# Patient Record
Sex: Male | Born: 1940
Health system: Southern US, Community
[De-identification: ages and names within clinical notes are randomized; demographics above are authoritative.]

## PROBLEM LIST (undated history)

## (undated) ENCOUNTER — Emergency Department (HOSPITAL_BASED_OUTPATIENT_CLINIC_OR_DEPARTMENT_OTHER): Admission: EM | Discharge status: Absent without leave | Payer: Medicare Other

## (undated) DIAGNOSIS — Z86010 Personal history of colonic polyps: Secondary | ICD-10-CM

## (undated) DIAGNOSIS — E785 Hyperlipidemia, unspecified: Secondary | ICD-10-CM

## (undated) DIAGNOSIS — I10 Essential (primary) hypertension: Secondary | ICD-10-CM

## (undated) DIAGNOSIS — F528 Other sexual dysfunction not due to a substance or known physiological condition: Secondary | ICD-10-CM

## (undated) HISTORY — PX: TONSILLECTOMY: SHX5217

## (undated) HISTORY — DX: Personal history of colonic polyps: Z86.010

## (undated) HISTORY — DX: Essential (primary) hypertension: I10

## (undated) HISTORY — DX: Other sexual dysfunction not due to a substance or known physiological condition: F52.8

## (undated) HISTORY — DX: Hyperlipidemia, unspecified: E78.5

---

## 2001-02-23 ENCOUNTER — Ambulatory Visit (HOSPITAL_COMMUNITY): Admission: RE | Admit: 2001-02-23 | Discharge: 2001-02-23 | Payer: Self-pay | Admitting: Gastroenterology

## 2001-02-23 ENCOUNTER — Encounter (INDEPENDENT_AMBULATORY_CARE_PROVIDER_SITE_OTHER): Payer: Self-pay | Admitting: *Deleted

## 2001-06-29 ENCOUNTER — Encounter (INDEPENDENT_AMBULATORY_CARE_PROVIDER_SITE_OTHER): Payer: Self-pay | Admitting: *Deleted

## 2001-06-29 ENCOUNTER — Ambulatory Visit (HOSPITAL_COMMUNITY): Admission: RE | Admit: 2001-06-29 | Discharge: 2001-06-29 | Payer: Self-pay | Admitting: Gastroenterology

## 2003-10-24 ENCOUNTER — Ambulatory Visit (HOSPITAL_COMMUNITY): Admission: RE | Admit: 2003-10-24 | Discharge: 2003-10-24 | Payer: Self-pay | Admitting: Gastroenterology

## 2003-10-24 ENCOUNTER — Encounter (INDEPENDENT_AMBULATORY_CARE_PROVIDER_SITE_OTHER): Payer: Self-pay | Admitting: Specialist

## 2007-04-06 ENCOUNTER — Encounter: Payer: Self-pay | Admitting: Family Medicine

## 2007-04-06 ENCOUNTER — Ambulatory Visit: Payer: Self-pay | Admitting: Surgery

## 2007-04-06 ENCOUNTER — Ambulatory Visit (HOSPITAL_COMMUNITY): Admission: RE | Admit: 2007-04-06 | Discharge: 2007-04-06 | Payer: Self-pay | Admitting: Family Medicine

## 2008-12-08 ENCOUNTER — Ambulatory Visit: Payer: Self-pay | Admitting: Family Medicine

## 2008-12-08 DIAGNOSIS — E785 Hyperlipidemia, unspecified: Secondary | ICD-10-CM

## 2008-12-08 DIAGNOSIS — F528 Other sexual dysfunction not due to a substance or known physiological condition: Secondary | ICD-10-CM

## 2008-12-08 DIAGNOSIS — I10 Essential (primary) hypertension: Secondary | ICD-10-CM

## 2008-12-08 HISTORY — DX: Hyperlipidemia, unspecified: E78.5

## 2008-12-08 HISTORY — DX: Essential (primary) hypertension: I10

## 2008-12-08 HISTORY — DX: Other sexual dysfunction not due to a substance or known physiological condition: F52.8

## 2009-02-20 ENCOUNTER — Ambulatory Visit: Payer: Self-pay | Admitting: Family Medicine

## 2009-02-20 LAB — CONVERTED CEMR LAB
ALT: 24 units/L (ref 0–53)
Albumin: 4 g/dL (ref 3.5–5.2)
BUN: 20 mg/dL (ref 6–23)
Basophils Relative: 0 % (ref 0.0–3.0)
Bilirubin, Direct: 0.1 mg/dL (ref 0.0–0.3)
CO2: 30 meq/L (ref 19–32)
Chloride: 109 meq/L (ref 96–112)
Cholesterol: 151 mg/dL (ref 0–200)
Creatinine, Ser: 0.9 mg/dL (ref 0.4–1.5)
Eosinophils Absolute: 0.2 10*3/uL (ref 0.0–0.7)
HCT: 48.6 % (ref 39.0–52.0)
Lymphs Abs: 1.7 10*3/uL (ref 0.7–4.0)
MCHC: 34.7 g/dL (ref 30.0–36.0)
MCV: 93 fL (ref 78.0–100.0)
Monocytes Absolute: 0.3 10*3/uL (ref 0.1–1.0)
Neutrophils Relative %: 49.4 % (ref 43.0–77.0)
PSA: 1.18 ng/mL (ref 0.10–4.00)
Platelets: 155 10*3/uL (ref 150.0–400.0)
Potassium: 4.1 meq/L (ref 3.5–5.1)
RBC: 5.22 M/uL (ref 4.22–5.81)
TSH: 1.56 microintl units/mL (ref 0.35–5.50)
Total Protein: 6.4 g/dL (ref 6.0–8.3)
Triglycerides: 48 mg/dL (ref 0.0–149.0)

## 2009-02-27 ENCOUNTER — Ambulatory Visit: Payer: Self-pay | Admitting: Family Medicine

## 2009-02-27 DIAGNOSIS — Z860100 Personal history of colon polyps, unspecified: Secondary | ICD-10-CM | POA: Insufficient documentation

## 2009-02-27 HISTORY — DX: Personal history of colonic polyps: Z86.010

## 2009-05-08 ENCOUNTER — Encounter (INDEPENDENT_AMBULATORY_CARE_PROVIDER_SITE_OTHER): Payer: Self-pay | Admitting: *Deleted

## 2009-05-08 ENCOUNTER — Encounter: Payer: Self-pay | Admitting: Family Medicine

## 2010-03-29 ENCOUNTER — Ambulatory Visit: Payer: Self-pay | Admitting: Family Medicine

## 2010-03-29 LAB — CONVERTED CEMR LAB
AST: 16 units/L (ref 0–37)
Albumin: 4.1 g/dL (ref 3.5–5.2)
Alkaline Phosphatase: 58 units/L (ref 39–117)
Basophils Relative: 0.5 % (ref 0.0–3.0)
Bilirubin Urine: NEGATIVE
Bilirubin, Direct: 0.2 mg/dL (ref 0.0–0.3)
CO2: 30 meq/L (ref 19–32)
Calcium: 9 mg/dL (ref 8.4–10.5)
Creatinine, Ser: 0.9 mg/dL (ref 0.4–1.5)
Eosinophils Absolute: 0.2 10*3/uL (ref 0.0–0.7)
Eosinophils Relative: 2.9 % (ref 0.0–5.0)
GFR calc non Af Amer: 87.32 mL/min (ref >60)
Hemoglobin: 16.9 g/dL (ref 13.0–17.0)
Ketones, ur: NEGATIVE mg/dL
LDL Cholesterol: 108 mg/dL — ABNORMAL HIGH (ref 0–99)
Leukocytes, UA: NEGATIVE
Lymphocytes Relative: 30.6 % (ref 12.0–46.0)
Monocytes Relative: 6.3 % (ref 3.0–12.0)
Neutro Abs: 3.6 10*3/uL (ref 1.4–7.7)
Neutrophils Relative %: 59.7 % (ref 43.0–77.0)
Nitrite: NEGATIVE
RBC: 5.29 M/uL (ref 4.22–5.81)
Sodium: 142 meq/L (ref 135–145)
Specific Gravity, Urine: 1.02 (ref 1.000–1.030)
Total CHOL/HDL Ratio: 5
Total Protein: 6.3 g/dL (ref 6.0–8.3)
Triglycerides: 87 mg/dL (ref 0.0–149.0)
Urobilinogen, UA: 0.2 (ref 0.0–1.0)
WBC: 6 10*3/uL (ref 4.5–10.5)
pH: 6.5 (ref 5.0–8.0)

## 2010-04-05 ENCOUNTER — Ambulatory Visit: Payer: Self-pay | Admitting: Family Medicine

## 2010-04-12 ENCOUNTER — Ambulatory Visit: Payer: Self-pay | Admitting: Family Medicine

## 2010-08-15 LAB — CONVERTED CEMR LAB: Cholesterol, target level: 200 mg/dL

## 2010-08-19 NOTE — Assessment & Plan Note (Signed)
Summary: CPX/CJR   Vital Signs:  Patient profile:   70 year old male Height:      67.5 inches Weight:      228 pounds BMI:     35.31 O2 Sat:      96 % Temp:     98.5 degrees F Pulse rate:   66 / minute Pulse rhythm:   regular Resp:     16 per minute BP sitting:   132 / 80  Vitals Entered By: Lynann Beaver CMA (April 05, 2010 11:30 AM)  Nutrition Counseling: Patient's BMI is greater than 25 and therefore counseled on weight management options. CC: cpx, Hypertension Management, Lipid Management Is Patient Diabetic? No Pain Assessment Patient in pain? no        History of Present Illness: Pt here for wellness assessment and follow up medical problems.  Here for Medicare AWV:  1.   Risk factors based on Past M, S, F history:  hypertension and hyperlipidemia.  Father had prostate cancer.  Pt has hx hyperplastic colon polyps. 2.   Physical Activities: retired.  No regular exercise. 3.   Depression/mood: no hx of depression or anxiety disorder.  No current depressive symptoms. 4.   Hearing: Mild defecit for high pitch but no functional impairment. 5.   ADL's: Fully independent in all ADLs/ 6.   Fall Risk: Low .  No major visual impairment (with glasses), orthopedic RFs, or vestibular problems. 7.   Home Safety: No concerns identified. 8.   Height, weight, &visual acuity:  Elevated BMI but wt stable.  Ht unchanged.  Vision checked yearly by eye doctor and no signi changes.  Wears glasses. 9.   Counseling: Needs more exercise and to lose some weight.  Handouts given and discussed strategies for wt loss. 10.   Labs ordered based on risk factors: see orders, CBC,BMP, LIPID, Hepatic, TSH 11.           Referral Coordination  cont yearly eye exam.  Will not need repeat colonoscopy until 4 years from now. 12.           Care Plan  Flu vaccine given. Other immunizations up to date.  Exercise more.  Colonoscopy 4 years.  Cont yearly eye exam. 13.            Cognitive Assessment  No  impairment in short or long term memory.  No deficiency in reasoning skills.  Hx of erectile dysfunction and Levitra works well for him.  Needs refills.  No side effects.  Hypertension History:      He denies headache, chest pain, palpitations, orthopnea, PND, peripheral edema, visual symptoms, neurologic problems, syncope, and side effects from treatment.  He notes no problems with any antihypertensive medication side effects.  Further comments include: mild dyspnea with exertion which is chronic and not assoc with any chest discomfort.        Positive major cardiovascular risk factors include male age 46 years old or older, hyperlipidemia, and hypertension.  Negative major cardiovascular risk factors include no history of diabetes, negative family history for ischemic heart disease, and non-tobacco-user status.        Further assessment for target organ damage reveals no history of ASHD, stroke/TIA, or peripheral vascular disease.    Lipid Management History:      Positive NCEP/ATP III risk factors include male age 41 years old or older, HDL cholesterol less than 40, and hypertension.  Negative NCEP/ATP III risk factors include non-diabetic, no family history for ischemic  heart disease, non-tobacco-user status, no ASHD (atherosclerotic heart disease), no prior stroke/TIA, no peripheral vascular disease, and no history of aortic aneurysm.      Clinical Review Panels:  Prevention   Last Colonoscopy:  Hyperplastic Polyp (05/08/2009)   Last PSA:  1.26 (03/29/2010)  Lipid Management   Cholesterol:  157 (03/29/2010)   LDL (bad choesterol):  108 (03/29/2010)   HDL (good cholesterol):  31.30 (03/29/2010)  Diabetes Management   Creatinine:  0.9 (03/29/2010)   Last Flu Vaccine:  Fluvax 3+ (04/05/2010)   Last Pneumovax:  given (07/19/2004)  CBC   WBC:  6.0 (03/29/2010)   RBC:  5.29 (03/29/2010)   Hgb:  16.9 (03/29/2010)   Hct:  48.9 (03/29/2010)   Platelets:  181.0 (03/29/2010)   MCV  92.5  (03/29/2010)   MCHC  34.6 (03/29/2010)   RDW  13.3 (03/29/2010)   PMN:  59.7 (03/29/2010)   Lymphs:  30.6 (03/29/2010)   Monos:  6.3 (03/29/2010)   Eosinophils:  2.9 (03/29/2010)   Basophil:  0.5 (03/29/2010)  Complete Metabolic Panel   Glucose:  99 (03/29/2010)   Sodium:  142 (03/29/2010)   Potassium:  4.5 (03/29/2010)   Chloride:  104 (03/29/2010)   CO2:  30 (03/29/2010)   BUN:  20 (03/29/2010)   Creatinine:  0.9 (03/29/2010)   Albumin:  4.1 (03/29/2010)   Total Protein:  6.3 (03/29/2010)   Calcium:  9.0 (03/29/2010)   Total Bili:  1.1 (03/29/2010)   Alk Phos:  58 (03/29/2010)   SGPT (ALT):  19 (03/29/2010)   SGOT (AST):  16 (03/29/2010)   Current Medications (verified): 1)  Amlodipine Besylate 10 Mg Tabs (Amlodipine Besylate) .... Once Daily 2)  Pravastatin Sodium 40 Mg Tabs (Pravastatin Sodium) .... Once Daily 3)  Adult Aspirin Low Strength 81 Mg Tbdp (Aspirin) .... Once Daily 4)  Acetaminophen Extra Strength 500 Mg Tabs (Acetaminophen) .... 2 Tabs Daily 5)  Levitra 20 Mg Tabs (Vardenafil Hcl) .... One By Mouth Once Daily As Needed  Allergies (verified): 1)  Penicillin V Potassium (Penicillin V Potassium)  Past History:  Past Medical History: Last updated: 02/27/2009 Colonic polyps, hx of Hyperlipidemia Hypertension erectile dysfunction  Past Surgical History: Last updated: 12/08/2008 Tonsillectomy  Family History: Last updated: 12/08/2008 Family History of Prostate CA 1st degree relative   Social History: Last updated: 04/05/2010 Married Former Smoker (pipe) Alcohol use-no Retired July 2011  Risk Factors: Smoking Status: quit (12/08/2008) PMH-FH-SH reviewed for relevance  Social History: Married Former Smoker (pipe) Alcohol use-no Retired July 2011  Review of Systems       The patient complains of suspicious skin lesions.  The patient denies anorexia, fever, weight loss, weight gain, vision loss, decreased hearing, hoarseness, chest pain,  syncope, dyspnea on exertion, peripheral edema, prolonged cough, headaches, hemoptysis, abdominal pain, melena, hematochezia, severe indigestion/heartburn, hematuria, incontinence, genital sores, muscle weakness, transient blindness, difficulty walking, depression, unusual weight change, abnormal bleeding, enlarged lymph nodes, and testicular masses.    Physical Exam  Skin:  couple of large skin tags R thign and waist area. Cervical Nodes:  No lymphadenopathy noted Psych:  Oriented X3, normally interactive, good eye contact, not anxious appearing, and not depressed appearing.     Impression & Recommendations:  Problem # 1:  PHYSICAL EXAMINATION (ICD-V70.0) flu vaccine given.  Pt needs to work on weight loss. Orders: EKG w/ Interpretation (93000) Medicare -1st Annual Wellness Visit [phone])  Problem # 2:  HYPERLIPIDEMIA (ICD-272.4)  His updated medication list for this problem includes:  Pravastatin Sodium 40 Mg Tabs (Pravastatin sodium) ..... Once daily  Problem # 3:  ERECTILE DYSFUNCTION (ICD-302.72)  His updated medication list for this problem includes:    Levitra 20 Mg Tabs (Vardenafil hcl) ..... One by mouth once daily as needed  Problem # 4:  HYPERTENSION (ICD-401.9)  His updated medication list for this problem includes:    Amlodipine Besylate 10 Mg Tabs (Amlodipine besylate) ..... Once daily  Complete Medication List: 1)  Amlodipine Besylate 10 Mg Tabs (Amlodipine besylate) .... Once daily 2)  Pravastatin Sodium 40 Mg Tabs (Pravastatin sodium) .... Once daily 3)  Acetaminophen Extra Strength 500 Mg Tabs (Acetaminophen) .... 2 tabs daily 4)  Levitra 20 Mg Tabs (Vardenafil hcl) .... One by mouth once daily as needed  Other Orders: Admin 1st Vaccine (24401) Flu Vaccine 77yrs + (02725)  Hypertension Assessment/Plan:      The patient's hypertensive risk group is category B: At least one risk factor (excluding diabetes) with no target organ damage.  His calculated 10 year  risk of coronary heart disease is 33 %.  Today's blood pressure is 132/80.    Lipid Assessment/Plan:      Based on NCEP/ATP III, the patient's risk factor category is "0-1 risk factors".  The patient's lipid goals are as follows: Total cholesterol goal is 200; LDL cholesterol goal is 100; HDL cholesterol goal is 40; Triglyceride goal is 150.    Patient Instructions: 1)  Schedule 30 minute follow up for skin lesion excision. 2)  It is important that you exercise reguarly at least 20 minutes 5 times a week. If you develop chest pain, have severe difficulty breathing, or feel very tired, stop exercising immediately and seek medical attention.  3)  You need to lose weight. Consider a lower calorie diet and regular exercise.  Prescriptions: LEVITRA 20 MG TABS (VARDENAFIL HCL) one by mouth once daily as needed  #6 x 11   Entered and Authorized by:   Evelena Peat MD   Signed by:   Evelena Peat MD on 04/05/2010   Method used:   Electronically to        [facility]* (retail)       [address] [place], [place]  [address]       Ph: [phone]       Fax: [phone]   RxID:   [id number]  Flu Vaccine Consent Questions     Do you have a history of severe allergic reactions to this vaccine? no    Any prior history of allergic reactions to egg and/or gelatin? no    Do you have a sensitivity to the preservative Thimersol? no    Do you have a past history of Guillan-Barre Syndrome? no    Do you currently have an acute febrile illness? no    Have you ever had a severe reaction to latex? no    Vaccine information given and explained to patient? yes    Are you currently pregnant? no    Lot Number:[id number]   Exp Date:01/15/2011   Site Given  Left Deltoid [id number]   RxID:   [id number]    Preventive Care Screening  Last Flu Shot:    Date:  04/05/2010    Results:  Fluvax 3+  Colonoscopy:    Date:  05/08/2009    Results:  Hyperplastic  Polyp   Last Tetanus Booster:    Date:  07/19/2004  Results:  Tdap   Last Pneumovax:    Date:  07/19/2004    Results:  given     Flu Vaccine Consent Questions     Do you have a history of severe allergic reactions to this vaccine? no    Any prior history of allergic reactions to egg and/or gelatin? no    Do you have a sensitivity to the preservative Thimersol? no    Do you have a past history of Guillan-Barre Syndrome? no    Do you currently have an acute febrile illness? no    Have you ever had a severe reaction to latex? no    Vaccine information given and explained to patient? yes    Are you currently pregnant? no    Lot Number:[id number]   Exp Date:01/15/2011   Site Given  Left Deltoid IM

## 2010-08-19 NOTE — Assessment & Plan Note (Signed)
Summary: skin lesion removal/njr   Vital Signs:  Patient profile:   70 year old male Weight:      228 pounds Temp:     98.1 degrees F oral BP sitting:   130 / 78  (left arm) Cuff size:   regular  Vitals Entered By: Sid Falcon LPN (April 12, 2010 10:44 AM)  History of Present Illness: Patient here for skin lesion removal. Right lower abdomen and right thigh large skin tags which are irritating because of location. Mid lower abdomen below umbilicus he has large scaly lesion which again is irritating because of location. No history of skin cancer. These lesions noted recently on physical examination.  Allergies: 1)  Penicillin V Potassium (Penicillin V Potassium)  Physical Exam  General:  Well-developed,well-nourished,in no acute distress; alert,appropriate and cooperative throughout examination Skin:  patient has large skin tags right lower abdomen and right upper anterior thigh. Lower abdomen near midline large brown well-demarcated scaly lesion consistent with seborrheic keratosis   Impression & Recommendations:  Problem # 1:  ACROCHORDON (ICD-701.9) after obtaining informed consent prepped R thigh and R lower abd lesions with alcohol and anesth with 1% xyloc with epi. Shave exsicion with #15 blade.  Minimal bleeding controlled  with drysol.  topical antibiotic and dressing applied. Orders: Shave Skin Lesion 0.6-1.0 cm/trunk/arm/leg (11301)  Problem # 2:  SEBORRHEIC KERATOSIS (ICD-702.19) after obtaining informed consent, treated with liquid N and pt tolerated well. Orders: Cryotherapy/Destruction benign or premalignant lesion (1st lesion)  (17000)  Complete Medication List: 1)  Amlodipine Besylate 10 Mg Tabs (Amlodipine besylate) .... Once daily 2)  Pravastatin Sodium 40 Mg Tabs (Pravastatin sodium) .... Once daily 3)  Acetaminophen Extra Strength 500 Mg Tabs (Acetaminophen) .... 2 tabs daily 4)  Levitra 20 Mg Tabs (Vardenafil hcl) .... One by mouth once daily as  needed  Patient Instructions: 1)  Keep area dry as possible for the first day and then clean daily with soap and water 2)  Use topical antibiotic such as Polysporin or Neosporin daily for the next 3-4 days 3)  Followup promptly for any signs of secondary infection such as redness, warmth, or puslike drainage

## 2010-09-10 ENCOUNTER — Telehealth: Payer: Self-pay | Admitting: Family Medicine

## 2010-09-10 MED ORDER — AMLODIPINE BESYLATE 10 MG PO TABS: 10.0000 mg | ORAL_TABLET | Freq: Every day | ORAL | Status: DC

## 2010-09-10 MED ORDER — PRAVASTATIN SODIUM 40 MG PO TABS: 40.0000 mg | ORAL_TABLET | Freq: Every evening | ORAL | Status: DC

## 2010-09-10 NOTE — Telephone Encounter (Signed)
Called wife - we have not recv'd fax from pharmacy from [place] - I will call out rx's to them. KIK

## 2010-09-10 NOTE — Telephone Encounter (Signed)
Pts wife called to ck on status of refill on med: pravastatin, norvasc...Marland KitchenMarland KitchenMarland Kitchen [facility] - [address] [place],  [place]  [address].... Fax # [phone],   # [phone]...Marland KitchenMarland Kitchen Pts # [id number]....the patient is out of med, please take care of asap.

## 2010-12-03 NOTE — Procedures (Signed)
[place]  Patient:    Jeremiah Merritt, Jeremiah Merritt                      MRN: [id number] Proc. Date: 02/23/01 Adm. Date:  [id number] Attending:  Nelda Marseille CC:         Delorse Lek, M.D.   Procedure Report  PROCEDURE PERFORMED:  Colonoscopy with multiple polypectomies.  ENDOSCOPIST:  Petra Kuba, M.D.  INDICATIONS FOR PROCEDURE:  Patient due for colonic screening.  Consent was signed after risks, benefits, methods, and options were thoroughly discussed in the office.  MEDICATIONS USED:  Demerol 70 mg, Versed 7 mg.  DESCRIPTION OF PROCEDURE:  Rectal inspection was pertinent for external hemorrhoids.  Digital exam was negative.  The video colonoscope was inserted and easily advanced around the colon to the cecum.  Multiple right-sided polyps were seen.  One sigmoid polyp and some very early diverticula were seen on insertion.  The cecum was identified by the appendiceal orifice and the ileocecal valve.  The prep was adequate.  There was some liquid stool that required washing and suctioning.  On slow withdrawal through the colon, multiple ascending hepatic flexure and proximal transverse polyps were all seen, snared, electrocautery applied and the majority of them were suctioned through the scope and collected in the trap. These were all semisessile. There were also some smaller ones which required one or two hot biopsies. There were some residual polyps after some of these sessile snares and multiple hot biopsies of the edges were obtained.  Two of the polyps that were removed, the pieces were too big to suction through the scope and using the retriever basket after multiple polypectomies, the two polyps were grabbed in the basket and the scope was removed and the polyps recovered.  The scope was reinserted and readvanced to the cecum.  On insertion, a sigmoid polyp was seen, snared, electrocautery applied.  Unfortunately, this polyp after  we removed it went proximally and when we found other polyp particles, it was not possible to say which polyp this was.  One other sigmoid small polyp was seen and was hot biopsied x 1.  Also on insertion, two descending polyps were seen and hot biopsied x 1 as well.  Based on not recovering the sigmoid polyp for sure, we went ahead and put all polyps in the same container.  We readvanced to the cecum and then on slow withdrawal, some of the polyps may have had residual tissue that was already removed and a few more hot biopsies were done.  One seemed to have a little bit more than just a hot biopsy worth and this area was snared, electrocautery applied and the polyp was suctioned through the scope after removal and collected in the trap.  The scope was then slowly withdrawn.  No additional findings or other polyps were seen.  Once back in the rectum, the scope was retroflexed, pertinent for some internal hemorrhoids.  The scope was straightened, air was withdrawn, the scope removed.  The patient tolerated the procedure excellently.  There was no obvious immediate complication.  ENDOSCOPIC DIAGNOSIS: 1. Internal and external hemorrhoids. 2. Early left left-sided diverticula. 3. One sigmoid small polyp snared and one small one hot. 4. Two descending small polyps hot. 5. Multiple hepatic flexure, ascending, proximal transverse, with multiple    snares and hot biopsies with the majority being small to medium-sized    sessile polyps. 6. Otherwise within normal limits  to the cecum.  PLAN:  Await pathology.  Stress two weeks postpolypectomy instructions. Probably repeat colonoscopy in six to 12 months but wait on path to determine. DD:  02/23/01 TD:  02/24/01 Job: [id number]

## 2010-12-03 NOTE — Procedures (Signed)
[facility]  Patient:    Jeremiah Merritt, Jeremiah Merritt Visit Number: [id number] MRN: [id number]          Service Type: END Location: ENDO Attending Physician:  Nelda Marseille Dictated by:   Petra Kuba, M.D. Proc. Date: 06/29/01 Admit Date:  06/29/2001   CC:         Delorse Lek, M.D.   Procedure Report  PROCEDURE:  Colonoscopy with polypectomy.  INDICATION:  Patient with significant adenomatous polyps, one with severe dysplasia, multiple sessile polyps.  Want to repeat colonoscopy to remove any residual polyps, make sure no missed lesions.  Consent was signed after risks, benefits, methods and options thoroughly discussed in the office.  MEDICATIONS:  Demerol 60, Versed 6.  DESCRIPTION OF PROCEDURE:  Rectal inspection is pertinent for external hemorrhoids, small.  Digital exam was negative.  The video colonoscope was inserted and on insertion in probably in the mid descending, a small polyp was seen, snared, electrocautery applied, and the polyp was suctioned through the scope and collected in the trap.  The scope was advanced to the cecum which did not require any position changes or any abdominal pressure.  One mid transverse moderate-sized sessile polyp was seen on insertion but no other significant lesions.  The cecum was identified by the appendiceal orifice and the ileocecal valve.  The prep was adequate.  There was some liquid stool that required washing and suctioning.  The scope was slowly withdrawn.  In the cecum, a 2 mm polyp was seen and was carefully hot biopsied x 1.  The right-sided polyps were put in the second container.  The two left-sided polyps were put in the first container.  On slow withdrawn, in the distal ascending, a small sessile polyp was seen, snared, electrocautery applied, and the polyp was suctioned through the scope and collected in the trap.  The scope was slowly withdrawn to the more proximal transverse, and two  other sessile polyps were seen, snared, electrocautery applied, and both were suctioned through the scope and collected in the trap.  There was one other small transverse polyp which was hot biopsied x 1.  One of the two polyps mentioned above was the one that was seen on insertion.  The scope was reinserted to the cecum, and the patient was rolled on his back and on slow withdrawal in the hepatic flexure, a polyp behind a fold was seen.  This required two snares to remove it completely.  Both pieces were suctioned through the scope and collected in the trap, and one residual hot biopsy was done of this area.  We went ahead and withdrew the scope with him on his back back to the splenic flexure.  We had previously withdrew the scope back to the splenic flexure with him on his left side.  No additional findings were seen. On slow withdrawal through the left side of the colon, another mid descending small polyp was seen, snared, electrocautery applied, and it was suctioned through the scope and collected in the trap.  Just distal to this, we found the previous polypectomy site and went ahead and took one cold biopsy of the base in case there was a tiny amount of residual adenomatous.  Based on the cautery burn, elected not to use cautery on that.  The scope was further withdrawn.  The sigmoid and rectum were normal.  The scope was retroflexed, pertinent for some internal hemorrhoids.  The scope was straightened and readvanced a short ways up  the left side of the colon; air was suctioned and scope removed.  The patient tolerated the procedure well.  There was no obvious immediate complication.  ENDOSCOPIC DIAGNOSES: 1. Internal and external hemorrhoids. 2. Two mid descending small polyps snared with one cold biopsy of the base. 3. Cecal and transverse small polyps, hot biopsied. 4. Four sessile ascending, hepatic flexure, and proximal transverse polyps,    status post snare with one  requiring hot biopsy of the base as well. 5. Otherwise within normal limits to the cecum.  PLAN:  Await pathology but probably recheck in one year.  Happy to see back sooner p.r.n., otherwise two week customary postpolypectomy instructions and return care to Dr. Doristine Counter for the customary health care maintenance. Dictated by:   Petra Kuba, M.D. Attending Physician:  Nelda Marseille DD:  06/29/01 TD:  06/29/01 Job: [id number]

## 2010-12-03 NOTE — Op Note (Signed)
NAME:  Jeremiah Merritt, Jeremiah Merritt                         ACCOUNT NO.:  [id number]   MEDICAL RECORD NO.:  [id number]                   PATIENT TYPE:  AMB   LOCATION:  ENDO                                 FACILITY:  [facility]   PHYSICIAN:  Petra Kuba, M.D.                 DATE OF BIRTH:  [birth year]   DATE OF PROCEDURE:  10/24/2003  DATE OF DISCHARGE:                                 OPERATIVE REPORT   PROCEDURE:  Colonoscopy.   INDICATIONS:  History of colon polyps.  Due for repeat screening.  Consent  was signed after risks, benefits, methods, and options were thoroughly  discussed multiple times in the past.   PREMEDICATIONS:  Demerol 50 mg, Versed 6 mg.   DESCRIPTION OF PROCEDURE:  Rectal inspection pertinent for external  hemorrhoids, small.  Digital exam was negative.  Video colonoscope was  inserted and easily advanced around the colon to the cecum.  This did not  require some abdominal pressure or any position changes.  In the distal  sigmoid a tiny polyp was seen but no other abnormalities on insertion.  The  cecum was identified by the appendiceal orifice and the ileocecal valve.  The prep on the right side was fairly adequate.  Did have some stool  adherent to the wall which could not be washed off.  The rest of the prep  was adequate.  On slow withdrawal through the colon, in the ascending, there  was an edematous fold __________  a polyp which was hot biopsied x1 and one  other tiny polyp in the ascending which was hot biopsied x1 as well, put in  the first container.  The scope was slowly withdrawn.  Two tiny descending  and transverse polyps were seen and were hot biopsied x1 and put in a second  container.  The scope was slowly withdrawn.  The polyps seen on insertion in  the distal sigmoid were seen, hot biopsied and put in a third container.  One other tiny sigmoid polyp was seen and was hot biopsied as well and put  in a third container.  There was a rare left-sided diverticula  seen but no  other abnormalities.  Anal rectal pull-through and retroflexing confirmed  some small hemorrhoids.  The scope was reinserted a short ways up the left  side of the colon.  Air was suctioned and the scope removed.  The patient  tolerated the procedure well.  There was no obvious immediate complication.   ENDOSCOPIC DIAGNOSES:  1. Internal and external hemorrhoids.  2. Rare left-sided diverticula.  3. Two sigmoid tiny polyps, hot biopsied.  4. One tiny descending and one tiny transverse polyp, hot biopsied.  5. Two ascending questionable polyps, one possibly just an edematous fold,     hot biopsied.  6. Otherwise within normal limits to the cecum.   PLAN:  Await pathology to determine future colonic treatment.  Happy to  see  back p.r.n.  Otherwise return care to Dr. Doristine Counter  for the customary health  care maintenance to include yearly rectals and guaiacs.                                               Petra Kuba, M.D.    MEM/MEDQ  D:  10/24/2003  T:  10/24/2003  Job:  [id number]   cc:   Marjory Lies, M.D.  [address]  [place]  [place] [address]  Fax: [phone]

## 2010-12-09 ENCOUNTER — Other Ambulatory Visit: Payer: Self-pay | Admitting: Family Medicine

## 2011-03-11 ENCOUNTER — Other Ambulatory Visit: Payer: Self-pay | Admitting: Family Medicine

## 2011-03-22 ENCOUNTER — Encounter: Payer: Self-pay | Admitting: Family Medicine

## 2011-04-09 ENCOUNTER — Other Ambulatory Visit: Payer: Self-pay | Admitting: Family Medicine

## 2011-04-11 ENCOUNTER — Encounter: Payer: Self-pay | Admitting: Family Medicine

## 2011-04-20 ENCOUNTER — Encounter: Payer: Self-pay | Admitting: Family Medicine

## 2011-04-20 ENCOUNTER — Ambulatory Visit (INDEPENDENT_AMBULATORY_CARE_PROVIDER_SITE_OTHER): Payer: Medicare Other | Admitting: Family Medicine

## 2011-04-20 VITALS — BP 130/80 | HR 80 | Temp 98.7°F | Resp 12 | Ht 67.75 in | Wt 229.0 lb

## 2011-04-20 DIAGNOSIS — Z23 Encounter for immunization: Secondary | ICD-10-CM

## 2011-04-20 DIAGNOSIS — Z0000 Encounter for general adult medical examination without abnormal findings: Secondary | ICD-10-CM

## 2011-04-20 DIAGNOSIS — I10 Essential (primary) hypertension: Secondary | ICD-10-CM

## 2011-04-20 DIAGNOSIS — E785 Hyperlipidemia, unspecified: Secondary | ICD-10-CM

## 2011-04-20 LAB — HEPATIC FUNCTION PANEL
ALT: 20 U/L (ref 0–53)
AST: 18 U/L (ref 0–37)
Albumin: 4.2 g/dL (ref 3.5–5.2)
Alkaline Phosphatase: 61 U/L (ref 39–117)
Total Protein: 6.5 g/dL (ref 6.0–8.3)

## 2011-04-20 LAB — LIPID PANEL
Cholesterol: 155 mg/dL (ref 0–200)
HDL: 38.7 mg/dL — ABNORMAL LOW (ref >39.00)
Triglycerides: 75 mg/dL (ref 0.0–149.0)

## 2011-04-20 LAB — PSA: PSA: 1.25 ng/mL (ref 0.10–4.00)

## 2011-04-20 LAB — BASIC METABOLIC PANEL
Calcium: 8.9 mg/dL (ref 8.4–10.5)
Creatinine, Ser: 0.9 mg/dL (ref 0.4–1.5)
Sodium: 142 mEq/L (ref 135–145)

## 2011-04-20 NOTE — Progress Notes (Signed)
Subjective:    Patient ID: Jeremiah Merritt, male    DOB: Nov 18, 1940, 70 y.o.   MRN: [id number]  HPI Patient here for Medicare wellness exam and medical followup. Medical problems include history of hyperlipidemia and hypertension. Medications reviewed. Compliant with all. Home blood pressures mostly around 140/70. Exercises though somewhat inconsistently. Requesting flu vaccine. Not clear if he had previous shingles vaccine. Pneumovax up-to-date. Colonoscopy 2010. Denies myalgias with pravastatin. No orthostasis or increased peripheral edema or headache with amlodipine.  1.  Risk factors based on Past Medical , Social, and Family history past medical history, social history, and family history as below. These were reviewed with no significant changes 2.  Limitations in physical activities low risk of falls. Exercises fairly regularly 3.  Depression/mood no depression or anxiety issues 4.  Hearing no recent hearing changes 5.  ADLs fully independent in all 6.  Cognitive function (orientation to time and place, language, writing, speech,memory) short and long-term memory intact. Language and judgment intact 7.  Home Safety no issues identified 8.  Height, weight, and visual acuity. Height and weight stable. Vision unchanged 9.  Counseling  Discussed more consistent exercise and weight loss. 10. Recommendation of preventive services. Flu vaccine given. Colonoscopy up to date. Check on coverage for shingles vaccine. Pneumovax previously given 11. Labs based on risk factors lipid panel, hepatic panel, basic metabolic panel, and PSA 12. Care Plan  As above.  Past Medical History  Diagnosis Date  . HYPERLIPIDEMIA 12/08/2008  . ERECTILE DYSFUNCTION 12/08/2008  . HYPERTENSION 12/08/2008  . COLONIC POLYPS, HX OF 02/27/2009   Past Surgical History  Procedure Date  . Tonsillectomy     does not have a smoking history on file. He does not have any smokeless tobacco history on file. His alcohol and drug  histories not on file. family history includes Cancer in his other. Allergies  Allergen Reactions  . Penicillins     REACTION: Hives       Review of Systems  Constitutional: Negative for fever, activity change, appetite change and fatigue.  HENT: Negative for ear pain, congestion and trouble swallowing.   Eyes: Negative for pain and visual disturbance.  Respiratory: Negative for cough, shortness of breath and wheezing.   Cardiovascular: Negative for chest pain and palpitations.  Gastrointestinal: Negative for nausea, vomiting, abdominal pain, diarrhea, constipation, blood in stool, abdominal distention and rectal pain.  Genitourinary: Negative for dysuria, hematuria and testicular pain.  Musculoskeletal: Negative for joint swelling and arthralgias.  Skin: Negative for rash.  Neurological: Negative for dizziness, syncope, weakness and headaches.  Hematological: Negative for adenopathy.  Psychiatric/Behavioral: Negative for confusion and dysphoric mood.       Objective:   Physical Exam  Constitutional: He is oriented to person, place, and time. He appears well-developed and well-nourished. No distress.  HENT:  Head: Normocephalic and atraumatic.  Right Ear: External ear normal.  Left Ear: External ear normal.  Mouth/Throat: Oropharynx is clear and moist.  Eyes: Conjunctivae and EOM are normal. Pupils are equal, round, and reactive to light.  Neck: Normal range of motion. Neck supple. No thyromegaly present.  Cardiovascular: Normal rate, regular rhythm and normal heart sounds.   No murmur heard. Pulmonary/Chest: No respiratory distress. He has no wheezes. He has no rales.  Abdominal: Soft. Bowel sounds are normal. He exhibits no distension and no mass. There is no tenderness. There is no rebound and no guarding.  Genitourinary: Rectum normal and prostate normal.  Musculoskeletal: He exhibits no edema.  Lymphadenopathy:  He has no cervical adenopathy.  Neurological: He is  alert and oriented to person, place, and time. He displays normal reflexes. No cranial nerve deficit.  Skin: No rash noted.  Psychiatric: He has a normal mood and affect. His behavior is normal. Judgment and thought content normal.          Assessment & Plan:  #1 health maintenance. Flu vaccine given. Check on shingles vaccine coverage. Obtain lab work. Work on exercise and weight loss #2 hypertension stable. Repeat reading 130/80. Continue current medication  #3 hyperlipidemia. Recheck lipid and hepatic panel. Consider baby aspirin 81 mg daily

## 2011-04-20 NOTE — Patient Instructions (Signed)
Get back on baby aspirin 81 mg daily Work on consistent exercise and weight loss

## 2011-04-21 NOTE — Progress Notes (Signed)
Quick Note:  Pt informed ______ 

## 2011-05-06 ENCOUNTER — Other Ambulatory Visit: Payer: Self-pay | Admitting: Family Medicine

## 2011-06-06 ENCOUNTER — Other Ambulatory Visit: Payer: Self-pay | Admitting: Family Medicine

## 2011-10-18 ENCOUNTER — Telehealth: Payer: Self-pay | Admitting: *Deleted

## 2011-10-18 NOTE — Telephone Encounter (Signed)
Pt not sure if he needs to fast for appt tomorrow morning.Jeremiah Merritt

## 2011-10-18 NOTE — Telephone Encounter (Signed)
no

## 2011-10-19 ENCOUNTER — Ambulatory Visit (INDEPENDENT_AMBULATORY_CARE_PROVIDER_SITE_OTHER): Payer: Medicare Other | Admitting: Family Medicine

## 2011-10-19 ENCOUNTER — Encounter: Payer: Self-pay | Admitting: Family Medicine

## 2011-10-19 VITALS — BP 140/80 | Temp 97.9°F | Wt 224.0 lb

## 2011-10-19 DIAGNOSIS — I10 Essential (primary) hypertension: Secondary | ICD-10-CM | POA: Diagnosis not present

## 2011-10-19 DIAGNOSIS — L989 Disorder of the skin and subcutaneous tissue, unspecified: Secondary | ICD-10-CM

## 2011-10-19 NOTE — Patient Instructions (Signed)
Continue to work on weight loss and regular exercise.

## 2011-10-19 NOTE — Progress Notes (Signed)
  Subjective:    Patient ID: Jeremiah Merritt, male    DOB: [birth year], 71 y.o.   MRN: [id number]  HPI  Medical followup. Patient has hypertension and hyperlipidemia. Medications reviewed. Compliant with all. Denies any headaches or dizziness. No chest pain or shortness of breath. Blood pressures at home run 140/80 roughly. Patient incidentally today noted to have hyperpigmented lesion right temporal region. No itching or bleeding. Possibly some slow growth in size over several months  Past Medical History  Diagnosis Date  . HYPERLIPIDEMIA 12/08/2008  . ERECTILE DYSFUNCTION 12/08/2008  . HYPERTENSION 12/08/2008  . COLONIC POLYPS, HX OF 02/27/2009   Past Surgical History  Procedure Date  . Tonsillectomy     reports that he has never smoked. He does not have any smokeless tobacco history on file. His alcohol and drug histories not on file. family history includes Cancer in his other. Allergies  Allergen Reactions  . Penicillins     REACTION: Hives     Review of Systems  Constitutional: Negative for fever, chills, appetite change and unexpected weight change.  Respiratory: Negative for cough and shortness of breath.   Cardiovascular: Negative for chest pain.  Gastrointestinal: Negative for abdominal pain.  Neurological: Negative for headaches.       Objective:   Physical Exam  Constitutional: He appears well-developed and well-nourished.  Cardiovascular: Normal rate and regular rhythm.   Pulmonary/Chest: Effort normal and breath sounds normal. No respiratory distress. He has no wheezes. He has no rales.  Musculoskeletal:       Trace edema legs bilaterally which is chronic  Skin:       Patient has about 6 mm somewhat asymmetric darkly pigmented lesion right temporal region. Minimally scaly. Minimally elevated          Assessment & Plan:  #1 hypertension. Stable. Work on weight loss and consistent exercise #2 somewhat atypical pigmented lesion right temporal region.  Recommend skin biopsy patient will return for shave excision

## 2011-11-17 ENCOUNTER — Encounter: Payer: Self-pay | Admitting: Family Medicine

## 2011-11-17 ENCOUNTER — Ambulatory Visit (INDEPENDENT_AMBULATORY_CARE_PROVIDER_SITE_OTHER): Payer: Medicare Other | Admitting: Family Medicine

## 2011-11-17 VITALS — BP 130/62 | Temp 97.8°F | Wt 223.0 lb

## 2011-11-17 DIAGNOSIS — L820 Inflamed seborrheic keratosis: Secondary | ICD-10-CM | POA: Diagnosis not present

## 2011-11-17 DIAGNOSIS — D2330 Other benign neoplasm of skin of unspecified part of face: Secondary | ICD-10-CM

## 2011-11-17 DIAGNOSIS — D229 Melanocytic nevi, unspecified: Secondary | ICD-10-CM

## 2011-11-17 NOTE — Progress Notes (Signed)
  Subjective:    Patient ID: Jeremiah Merritt, male    DOB: 1940-11-30, 71 y.o.   MRN: [id number]  HPI  Atypical pigmented lesion right face temporal region noted recently on exam. No itching or bleeding. No personal or family history of skin cancer. Would recommend this be biopsied and patient consented. No recent appetite or weight changes. He's lost one pound due to his efforts at losing weight.   Review of Systems  Constitutional: Negative for appetite change and unexpected weight change.  Hematological: Negative for adenopathy.       Objective:   Physical Exam  Constitutional: He appears well-developed and well-nourished.  Cardiovascular: Normal rate and regular rhythm.   Skin:       As 6 x 5 mm slightly asymmetric skin lesion right temporal region. This has some asymmetry as well as irregular border and some color variation. No ulceration.          Assessment & Plan:  Atypical pigmented lesion right face.  Discussed risks and benefits of shave excision including risks of bleeding, bruising, scar formation, and infection and patient consented to proceed.  Skin prepped with betadine and alcohol and shave excision of lesion with #15 blade with minimal bleeding.  Patient tolerated well.  Antibiotic and dressing  Applied. Specimen sent to pathology

## 2011-11-17 NOTE — Patient Instructions (Signed)
Keep wound dry for the first 24 hours then clean daily with soap and water for one week. Apply topical antibiotic daily for 3-4 days. Keep covered with clean dressing for 4-5 days. Follow up promptly for any signs of infection such as redness, warmth, pain, or drainage.  

## 2011-11-22 NOTE — Progress Notes (Signed)
Quick Note:  Pt informed ______ 

## 2012-02-01 DIAGNOSIS — H04129 Dry eye syndrome of unspecified lacrimal gland: Secondary | ICD-10-CM | POA: Diagnosis not present

## 2012-02-15 DIAGNOSIS — H40019 Open angle with borderline findings, low risk, unspecified eye: Secondary | ICD-10-CM | POA: Diagnosis not present

## 2012-04-19 ENCOUNTER — Ambulatory Visit (INDEPENDENT_AMBULATORY_CARE_PROVIDER_SITE_OTHER): Payer: Medicare Other | Admitting: Family Medicine

## 2012-04-19 ENCOUNTER — Encounter: Payer: Self-pay | Admitting: Family Medicine

## 2012-04-19 VITALS — BP 142/82 | Temp 98.5°F | Wt 219.0 lb

## 2012-04-19 DIAGNOSIS — Z23 Encounter for immunization: Secondary | ICD-10-CM | POA: Diagnosis not present

## 2012-04-19 DIAGNOSIS — I10 Essential (primary) hypertension: Secondary | ICD-10-CM

## 2012-04-19 DIAGNOSIS — N320 Bladder-neck obstruction: Secondary | ICD-10-CM

## 2012-04-19 DIAGNOSIS — F528 Other sexual dysfunction not due to a substance or known physiological condition: Secondary | ICD-10-CM | POA: Diagnosis not present

## 2012-04-19 DIAGNOSIS — E785 Hyperlipidemia, unspecified: Secondary | ICD-10-CM | POA: Diagnosis not present

## 2012-04-19 LAB — LIPID PANEL
Cholesterol: 149 mg/dL (ref 0–200)
VLDL: 12 mg/dL (ref 0.0–40.0)

## 2012-04-19 LAB — HEPATIC FUNCTION PANEL
ALT: 15 U/L (ref 0–53)
Alkaline Phosphatase: 63 U/L (ref 39–117)
Bilirubin, Direct: 0.2 mg/dL (ref 0.0–0.3)
Total Bilirubin: 1 mg/dL (ref 0.3–1.2)
Total Protein: 6.5 g/dL (ref 6.0–8.3)

## 2012-04-19 LAB — BASIC METABOLIC PANEL
BUN: 19 mg/dL (ref 6–23)
Calcium: 9 mg/dL (ref 8.4–10.5)
Creatinine, Ser: 0.9 mg/dL (ref 0.4–1.5)
GFR: 89.07 mL/min (ref >60.00)
Glucose, Bld: 95 mg/dL (ref 70–99)

## 2012-04-19 MED ORDER — VARDENAFIL HCL 20 MG PO TABS: 20.0000 mg | ORAL_TABLET | Freq: Every day | ORAL | Status: DC | PRN

## 2012-04-19 NOTE — Addendum Note (Signed)
Addended by: Melchor Amour on: 04/19/2012 09:19 AM   Modules accepted: Orders

## 2012-04-19 NOTE — Progress Notes (Signed)
  Subjective:    Patient ID: Jeremiah Merritt, male    DOB: 09-23-1940, 71 y.o.   MRN: [id number]  HPI  Patient seen for medical followup. History of hypertension, hyperlipidemia, and erectile dysfunction. Medications reviewed. Compliant with all. Exercising regularly. 5 pounds of weight loss due to his efforts since last visit. Exercises most days of the week. No dizziness. No chest pains. He has some nocturia and occasional slow stream. Requesting repeat PSA. Requesting refills of Levitra for erectile dysfunction. Blood pressure well controlled around amlodipine. Systolic blood pressures ranging 105-120 by his checks. No history of CAD or peripheral vascular disease. Patient needs flu vaccine. Nonsmoker.  Past Medical History  Diagnosis Date  . HYPERLIPIDEMIA 12/08/2008  . ERECTILE DYSFUNCTION 12/08/2008  . HYPERTENSION 12/08/2008  . COLONIC POLYPS, HX OF 02/27/2009   Past Surgical History  Procedure Date  . Tonsillectomy     reports that he has never smoked. He does not have any smokeless tobacco history on file. His alcohol and drug histories not on file. family history includes Cancer in his other. Allergies  Allergen Reactions  . Penicillins     REACTION: Hives  '    Review of Systems  Constitutional: Negative for appetite change, fatigue and unexpected weight change.  Eyes: Negative for visual disturbance.  Respiratory: Negative for cough, chest tightness and shortness of breath.   Cardiovascular: Negative for chest pain, palpitations and leg swelling.  Gastrointestinal: Negative for abdominal pain.  Genitourinary: Negative for dysuria and hematuria.  Skin: Negative for rash.  Neurological: Negative for dizziness, syncope, weakness, light-headedness and headaches.  Psychiatric/Behavioral: Negative for dysphoric mood.       Objective:   Physical Exam  Constitutional: He appears well-developed and well-nourished.  HENT:  Right Ear: External ear normal.  Left Ear:  External ear normal.  Mouth/Throat: Oropharynx is clear and moist.  Neck: Neck supple. No thyromegaly present.  Cardiovascular: Normal rate and regular rhythm.   No murmur heard. Pulmonary/Chest: Effort normal and breath sounds normal. No respiratory distress. He has no wheezes. He has no rales.  Musculoskeletal: He exhibits no edema.  Lymphadenopathy:    He has no cervical adenopathy.          Assessment & Plan:  #1 hypertension. Borderline control here but well controlled by home readings. Continue close monitoring. Continue weight loss efforts.  #2 hyperlipidemia. Recheck lipid and hepatic panel  #3 probable mild BPH. Recheck PSA per patient request  #4 health maintenance. Flu vaccine given

## 2012-04-20 NOTE — Progress Notes (Signed)
Quick Note:  Pt informed ______ 

## 2012-06-06 ENCOUNTER — Other Ambulatory Visit: Payer: Self-pay | Admitting: Family Medicine

## 2012-10-18 ENCOUNTER — Ambulatory Visit: Payer: PRIVATE HEALTH INSURANCE | Admitting: Family Medicine

## 2012-10-23 ENCOUNTER — Ambulatory Visit (INDEPENDENT_AMBULATORY_CARE_PROVIDER_SITE_OTHER): Payer: Medicare Other | Admitting: Family Medicine

## 2012-10-23 ENCOUNTER — Encounter: Payer: Self-pay | Admitting: Family Medicine

## 2012-10-23 VITALS — BP 140/80 | Temp 98.8°F | Wt 227.0 lb

## 2012-10-23 DIAGNOSIS — I10 Essential (primary) hypertension: Secondary | ICD-10-CM | POA: Diagnosis not present

## 2012-10-23 NOTE — Patient Instructions (Signed)
Try to lose some weight and establish consistent exercise.

## 2012-10-23 NOTE — Progress Notes (Signed)
  Subjective:    Patient ID: Jeremiah Merritt, male    DOB: July 13, 1941, 72 y.o.   MRN: [id number]  HPI followup hypertension. Patient has not been exercising much over the winter and has gained about 8 pounds. Overall feels well. No dizziness. No chest pains. Takes amlodipine 10 mg daily for hypertension. Hyperlipidemia treated with pravastatin 40 mg. Labs last fall were unremarkable. He plans to start back more consistent exercise soon. No history of smoking.  Past Medical History  Diagnosis Date  . HYPERLIPIDEMIA 12/08/2008  . ERECTILE DYSFUNCTION 12/08/2008  . HYPERTENSION 12/08/2008  . COLONIC POLYPS, HX OF 02/27/2009   Past Surgical History  Procedure Laterality Date  . Tonsillectomy      reports that he has never smoked. He does not have any smokeless tobacco history on file. His alcohol and drug histories are not on file. family history includes Cancer in his other. Allergies  Allergen Reactions  . Penicillins     REACTION: Hives       Review of Systems  Constitutional: Negative for fatigue.  Eyes: Negative for visual disturbance.  Respiratory: Negative for cough, chest tightness and shortness of breath.   Cardiovascular: Negative for chest pain, palpitations and leg swelling.  Neurological: Negative for dizziness, syncope, weakness, light-headedness and headaches.       Objective:   Physical Exam  Constitutional: He appears well-developed and well-nourished.  Neck: Neck supple. No thyromegaly present.  Cardiovascular: Normal rate and regular rhythm.   Pulmonary/Chest: Effort normal and breath sounds normal. No respiratory distress. He has no wheezes. He has no rales.  Musculoskeletal: He exhibits no edema.          Assessment & Plan:    Hypertension. Marginal control. Lose some weight and establish more consistent exercise. Continue current medications. Reassess 6 months.

## 2013-02-22 DIAGNOSIS — H40019 Open angle with borderline findings, low risk, unspecified eye: Secondary | ICD-10-CM | POA: Diagnosis not present

## 2013-03-29 DIAGNOSIS — H40019 Open angle with borderline findings, low risk, unspecified eye: Secondary | ICD-10-CM | POA: Diagnosis not present

## 2013-04-24 ENCOUNTER — Ambulatory Visit (INDEPENDENT_AMBULATORY_CARE_PROVIDER_SITE_OTHER): Payer: Medicare Other | Admitting: Family Medicine

## 2013-04-24 ENCOUNTER — Encounter: Payer: Self-pay | Admitting: Family Medicine

## 2013-04-24 VITALS — BP 136/80 | HR 60 | Temp 98.0°F | Wt 218.0 lb

## 2013-04-24 DIAGNOSIS — F528 Other sexual dysfunction not due to a substance or known physiological condition: Secondary | ICD-10-CM | POA: Diagnosis not present

## 2013-04-24 DIAGNOSIS — E785 Hyperlipidemia, unspecified: Secondary | ICD-10-CM

## 2013-04-24 DIAGNOSIS — N320 Bladder-neck obstruction: Secondary | ICD-10-CM | POA: Diagnosis not present

## 2013-04-24 DIAGNOSIS — Z23 Encounter for immunization: Secondary | ICD-10-CM | POA: Diagnosis not present

## 2013-04-24 DIAGNOSIS — I10 Essential (primary) hypertension: Secondary | ICD-10-CM | POA: Diagnosis not present

## 2013-04-24 LAB — LIPID PANEL
Cholesterol: 146 mg/dL (ref 0–200)
HDL: 38.5 mg/dL — ABNORMAL LOW (ref >39.00)
Triglycerides: 91 mg/dL (ref 0.0–149.0)
VLDL: 18.2 mg/dL (ref 0.0–40.0)

## 2013-04-24 LAB — HEPATIC FUNCTION PANEL
ALT: 21 U/L (ref 0–53)
AST: 24 U/L (ref 0–37)
Albumin: 4.3 g/dL (ref 3.5–5.2)
Total Protein: 6.6 g/dL (ref 6.0–8.3)

## 2013-04-24 LAB — BASIC METABOLIC PANEL
BUN: 16 mg/dL (ref 6–23)
CO2: 30 mEq/L (ref 19–32)
Calcium: 9.2 mg/dL (ref 8.4–10.5)
Glucose, Bld: 111 mg/dL — ABNORMAL HIGH (ref 70–99)
Sodium: 143 mEq/L (ref 135–145)

## 2013-04-24 MED ORDER — VARDENAFIL HCL 20 MG PO TABS: 20.0000 mg | ORAL_TABLET | Freq: Every day | ORAL | Status: DC | PRN

## 2013-04-24 NOTE — Progress Notes (Signed)
  Subjective:    Patient ID: Jeremiah Merritt, male    DOB: 12/05/1940, 72 y.o.   MRN: [id number]  HPI Patient seen for medical followup Medical problems history of hypertension, hyperlipidemia, erectile dysfunction. Medications reviewed and compliant with all. He denies any chest pains, dizziness, headaches. He is exercising 3-5 times per week. He has lost about 10 pounds since last year. Overall feels well. Does have some occasional nocturia and occasional slow stream but he is not interested in medications at this time.  His father had prostate cancer and he is requesting PSA after full discussion of risk and benefits. Patient needs flu vaccine  Past Medical History  Diagnosis Date  . HYPERLIPIDEMIA 12/08/2008  . ERECTILE DYSFUNCTION 12/08/2008  . HYPERTENSION 12/08/2008  . COLONIC POLYPS, HX OF 02/27/2009   Past Surgical History  Procedure Laterality Date  . Tonsillectomy      reports that he has never smoked. He does not have any smokeless tobacco history on file. His alcohol and drug histories are not on file. family history includes Cancer in his other. Allergies  Allergen Reactions  . Penicillins     REACTION: Hives      Review of Systems  Constitutional: Negative for fatigue.  Eyes: Negative for visual disturbance.  Respiratory: Negative for cough, chest tightness and shortness of breath.   Cardiovascular: Negative for chest pain, palpitations and leg swelling.  Neurological: Negative for dizziness, syncope, weakness, light-headedness and headaches.       Objective:   Physical Exam  Constitutional: He appears well-developed and well-nourished.  HENT:  Mouth/Throat: Oropharynx is clear and moist.  Neck: Neck supple. No thyromegaly present.  Cardiovascular: Normal rate and regular rhythm.   Pulmonary/Chest: Effort normal and breath sounds normal. No respiratory distress. He has no wheezes. He has no rales.  Musculoskeletal: He exhibits no edema.  Psychiatric: He  has a normal mood and affect. His behavior is normal.          Assessment & Plan:  #1 hypertension. Well controlled. Continue amlodipine.  Continue weight loss efforts #2 hyperlipidemia. Check lipid and hepatic panel. Continue pravastatin #3 erectile dysfunction. Refill Levitra. #4 health maintenance. Flu vaccine given

## 2013-04-25 ENCOUNTER — Encounter: Payer: Self-pay | Admitting: Family Medicine

## 2013-04-25 LAB — PSA: PSA: 1.31 ng/mL (ref 0.10–4.00)

## 2013-05-27 ENCOUNTER — Ambulatory Visit (INDEPENDENT_AMBULATORY_CARE_PROVIDER_SITE_OTHER): Payer: Medicare Other | Admitting: Family Medicine

## 2013-05-27 ENCOUNTER — Encounter: Payer: Self-pay | Admitting: Family Medicine

## 2013-05-27 VITALS — BP 132/72 | HR 64 | Temp 98.0°F | Wt 223.0 lb

## 2013-05-27 DIAGNOSIS — L259 Unspecified contact dermatitis, unspecified cause: Secondary | ICD-10-CM | POA: Diagnosis not present

## 2013-05-27 MED ORDER — TRIAMCINOLONE ACETONIDE 0.1 % EX CREA: 1.0000 "application " | TOPICAL_CREAM | Freq: Two times a day (BID) | CUTANEOUS | Status: DC

## 2013-05-27 NOTE — Progress Notes (Signed)
Pre visit review using our clinic review tool, if applicable. No additional management support is needed unless otherwise documented below in the visit note. 

## 2013-05-27 NOTE — Patient Instructions (Signed)

## 2013-05-27 NOTE — Progress Notes (Signed)
  Subjective:    Patient ID: Jeremiah Merritt, male    DOB: 09-Mar-1941, 72 y.o.   MRN: [id number]  HPI Patient is seen for pruritic rash mostly left forearm and to a lesser extent right forearm. Onset about 2 weeks ago. He noticed after doing some gardening. He was concerned because he saw TV commercial and was concerned he may have shingles. He does not have any burning or pain. No other areas of rash involvement. Itching is moderate to mild. He has used calamine lotion without much improvement. No exacerbating factors. No fevers or chills.  Past Medical History  Diagnosis Date  . HYPERLIPIDEMIA 12/08/2008  . ERECTILE DYSFUNCTION 12/08/2008  . HYPERTENSION 12/08/2008  . COLONIC POLYPS, HX OF 02/27/2009   Past Surgical History  Procedure Laterality Date  . Tonsillectomy      reports that he has never smoked. He does not have any smokeless tobacco history on file. His alcohol and drug histories are not on file. family history includes Cancer in his other. Allergies  Allergen Reactions  . Penicillins     REACTION: Hives      Review of Systems  Constitutional: Negative for fever and chills.  Skin: Positive for rash.       Objective:   Physical Exam  Constitutional: He appears well-developed and well-nourished.  Cardiovascular: Normal rate.   Pulmonary/Chest: Effort normal and breath sounds normal. No respiratory distress. He has no wheezes. He has no rales.  Skin: Rash noted.  Patient has erythematous slightly raised nontender rash scattered left forearm and to a lesser extent right forearm. No pustules          Assessment & Plan:  Contact dermatitis. No evidence for shingles. Discussed options. He prefers to avoid systemic steroids. Triamcinolone 0.1% cream to use twice a day as needed.

## 2013-05-30 ENCOUNTER — Other Ambulatory Visit: Payer: Self-pay | Admitting: Family Medicine

## 2013-06-06 ENCOUNTER — Other Ambulatory Visit: Payer: Self-pay | Admitting: Family Medicine

## 2014-04-22 DIAGNOSIS — H40023 Open angle with borderline findings, high risk, bilateral: Secondary | ICD-10-CM | POA: Diagnosis not present

## 2014-04-22 DIAGNOSIS — H2513 Age-related nuclear cataract, bilateral: Secondary | ICD-10-CM | POA: Diagnosis not present

## 2014-05-06 ENCOUNTER — Ambulatory Visit (INDEPENDENT_AMBULATORY_CARE_PROVIDER_SITE_OTHER): Payer: Medicare Other

## 2014-05-06 DIAGNOSIS — Z23 Encounter for immunization: Secondary | ICD-10-CM | POA: Diagnosis not present

## 2014-05-23 ENCOUNTER — Other Ambulatory Visit: Payer: Self-pay | Admitting: Family Medicine

## 2014-05-27 DIAGNOSIS — H40023 Open angle with borderline findings, high risk, bilateral: Secondary | ICD-10-CM | POA: Diagnosis not present

## 2014-06-17 ENCOUNTER — Ambulatory Visit (INDEPENDENT_AMBULATORY_CARE_PROVIDER_SITE_OTHER): Payer: Medicare Other | Admitting: Family Medicine

## 2014-06-17 ENCOUNTER — Encounter: Payer: Self-pay | Admitting: Family Medicine

## 2014-06-17 VITALS — BP 128/78 | HR 72 | Temp 97.6°F | Resp 16 | Ht 68.25 in | Wt 223.4 lb

## 2014-06-17 DIAGNOSIS — E785 Hyperlipidemia, unspecified: Secondary | ICD-10-CM | POA: Diagnosis not present

## 2014-06-17 DIAGNOSIS — E669 Obesity, unspecified: Secondary | ICD-10-CM | POA: Diagnosis not present

## 2014-06-17 DIAGNOSIS — Z23 Encounter for immunization: Secondary | ICD-10-CM | POA: Diagnosis not present

## 2014-06-17 DIAGNOSIS — R350 Frequency of micturition: Secondary | ICD-10-CM

## 2014-06-17 DIAGNOSIS — Z0000 Encounter for general adult medical examination without abnormal findings: Secondary | ICD-10-CM | POA: Diagnosis not present

## 2014-06-17 DIAGNOSIS — I10 Essential (primary) hypertension: Secondary | ICD-10-CM

## 2014-06-17 DIAGNOSIS — N401 Enlarged prostate with lower urinary tract symptoms: Secondary | ICD-10-CM | POA: Diagnosis not present

## 2014-06-17 LAB — HEPATIC FUNCTION PANEL
ALBUMIN: 4.2 g/dL (ref 3.5–5.2)
ALK PHOS: 55 U/L (ref 39–117)
ALT: 21 U/L (ref 0–53)
AST: 18 U/L (ref 0–37)
BILIRUBIN DIRECT: 0.1 mg/dL (ref 0.0–0.3)
BILIRUBIN TOTAL: 0.9 mg/dL (ref 0.2–1.2)
Total Protein: 6.7 g/dL (ref 6.0–8.3)

## 2014-06-17 LAB — LIPID PANEL
Cholesterol: 146 mg/dL (ref 0–200)
HDL: 35 mg/dL — ABNORMAL LOW (ref >39.00)
LDL Cholesterol: 94 mg/dL (ref 0–99)
NonHDL: 111
Total CHOL/HDL Ratio: 4
Triglycerides: 83 mg/dL (ref 0.0–149.0)
VLDL: 16.6 mg/dL (ref 0.0–40.0)

## 2014-06-17 LAB — CBC WITH DIFFERENTIAL/PLATELET
BASOS ABS: 0 10*3/uL (ref 0.0–0.1)
Basophils Relative: 0.3 % (ref 0.0–3.0)
EOS ABS: 0.1 10*3/uL (ref 0.0–0.7)
Eosinophils Relative: 2.7 % (ref 0.0–5.0)
HCT: 51.2 % (ref 39.0–52.0)
HEMOGLOBIN: 17.1 g/dL — AB (ref 13.0–17.0)
Lymphocytes Relative: 31.5 % (ref 12.0–46.0)
Lymphs Abs: 1.8 10*3/uL (ref 0.7–4.0)
MCHC: 33.4 g/dL (ref 30.0–36.0)
MCV: 91.1 fl (ref 78.0–100.0)
MONO ABS: 0.4 10*3/uL (ref 0.1–1.0)
Monocytes Relative: 6.3 % (ref 3.0–12.0)
Neutro Abs: 3.3 10*3/uL (ref 1.4–7.7)
Neutrophils Relative %: 59.2 % (ref 43.0–77.0)
PLATELETS: 176 10*3/uL (ref 150.0–400.0)
RBC: 5.62 Mil/uL (ref 4.22–5.81)
RDW: 13.4 % (ref 11.5–15.5)
WBC: 5.6 10*3/uL (ref 4.0–10.5)

## 2014-06-17 LAB — BASIC METABOLIC PANEL
BUN: 20 mg/dL (ref 6–23)
CO2: 25 mEq/L (ref 19–32)
Calcium: 8.7 mg/dL (ref 8.4–10.5)
Chloride: 105 mEq/L (ref 96–112)
Creatinine, Ser: 0.9 mg/dL (ref 0.4–1.5)
GFR: 90.9 mL/min (ref >60.00)
Glucose, Bld: 91 mg/dL (ref 70–99)
Potassium: 3.7 mEq/L (ref 3.5–5.1)
Sodium: 140 mEq/L (ref 135–145)

## 2014-06-17 LAB — PSA: PSA: 1.74 ng/mL (ref 0.10–4.00)

## 2014-06-17 LAB — TSH: TSH: 1.64 u[IU]/mL (ref 0.35–4.50)

## 2014-06-17 NOTE — Assessment & Plan Note (Signed)
Pt's PE WNL.  Due for colonoscopy- pt plans to schedule when wife is more stable.  Written screening schedule updated and given to pt.  Check labs.  Anticipatory guidance provided.

## 2014-06-17 NOTE — Assessment & Plan Note (Signed)
Chronic problem.  Tolerating statin w/o difficulty.  Check labs.  Adjust meds prn  

## 2014-06-17 NOTE — Patient Instructions (Signed)
Follow up in 6 months to recheck BP and cholesterol Keep up the good work on healthy diet and attempt to resume regular exercise We'll notify you of your lab results and make any changes if needed Schedule your colonoscopy at your convenience Call with any questions or concerns Happy Holidays!!!

## 2014-06-17 NOTE — Assessment & Plan Note (Signed)
Chronic problem.  Tolerating Norvasc w/o difficulty w/ good BP control.  Asymptomatic.  No med changes at this time.

## 2014-06-17 NOTE — Progress Notes (Signed)
Pre visit review using our clinic review tool, if applicable. No additional management support is needed unless otherwise documented below in the visit note. 

## 2014-06-17 NOTE — Progress Notes (Signed)
   Subjective:    Patient ID: Jeremiah Merritt, male    DOB: Dec 10, 1940, 73 y.o.   MRN: [id number]  HPI Here today for CPE.  Risk Factors: HTN- chronic problem, on Norvasc daily. Hyperlipidemia- chronic problem, on Pravastatin. 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  Not interested in meds this time Hearing: normal to conversational tones, mildly decreased to whispered voice at 6 ft ADL's: independent Cognitive: normal linear thought process, memory and attention intact Home Safety: safe at home, lives w/ wife Height, Weight, BMI, Visual Acuity: see vitals, vision corrected to 20/20 w/ glasses Counseling: Due for colonoscopy- plans to schedule when wife is more stable, due for Prevnar Labs Ordered: See A&P Care Plan: See A&P    Review of Systems Patient reports no vision/hearing changes, anorexia, fever ,adenopathy, persistant/recurrent hoarseness, swallowing issues, chest pain, palpitations, edema, persistant/recurrent cough, hemoptysis, dyspnea (rest,exertional, paroxysmal nocturnal), gastrointestinal  bleeding (melena, rectal bleeding), abdominal pain, excessive heart burn, GU symptoms (dysuria, hematuria, voiding/incontinence issues) syncope, focal weakness, memory loss, numbness & tingling, skin/hair/nail changes, depression, anxiety, abnormal bruising/bleeding, musculoskeletal symptoms/signs.     Objective:   Physical Exam General Appearance:    Alert, cooperative, no distress, appears stated age  Head:    Normocephalic, without obvious abnormality, atraumatic  Eyes:    PERRL, conjunctiva/corneas clear, EOM's intact, fundi    benign, both eyes       Ears:    Normal TM's and external ear canals, both ears  Nose:   Nares normal, septum midline, mucosa normal, no drainage   or sinus tenderness  Throat:   Lips, mucosa, and tongue normal; teeth and gums normal  Neck:    Supple, symmetrical, trachea midline, no adenopathy;       thyroid:  No enlargement/tenderness/nodules  Back:     Symmetric, no curvature, ROM normal, no CVA tenderness  Lungs:     Clear to auscultation bilaterally, respirations unlabored  Chest wall:    No tenderness or deformity  Heart:    Regular rate and rhythm, S1 and S2 normal, no murmur, rub   or gallop  Abdomen:     Soft, non-tender, bowel sounds active all four quadrants,    no masses, no organomegaly  Genitalia:    Normal male without lesion, masses,discharge or tenderness  Rectal:    Deferred at pt's request  Extremities:   Extremities normal, atraumatic, no cyanosis or edema  Pulses:   2+ and symmetric all extremities  Skin:   Skin color, texture, turgor normal, no rashes or lesions  Lymph nodes:   Cervical, supraclavicular, and axillary nodes normal  Neurologic:   CNII-XII intact. Normal strength, sensation and reflexes      throughout          Assessment & Plan:

## 2014-06-17 NOTE — Assessment & Plan Note (Signed)
New.  Pt has gained weight due to no longer exercising.  Encouraged him to resume exercise for both weight loss and stress outlet.  Will follow.

## 2014-06-24 ENCOUNTER — Other Ambulatory Visit: Payer: Self-pay | Admitting: Family Medicine

## 2014-07-03 ENCOUNTER — Other Ambulatory Visit: Payer: Self-pay | Admitting: Dermatology

## 2014-07-03 DIAGNOSIS — L821 Other seborrheic keratosis: Secondary | ICD-10-CM | POA: Diagnosis not present

## 2014-07-03 DIAGNOSIS — D239 Other benign neoplasm of skin, unspecified: Secondary | ICD-10-CM | POA: Diagnosis not present

## 2014-07-03 DIAGNOSIS — D225 Melanocytic nevi of trunk: Secondary | ICD-10-CM | POA: Diagnosis not present

## 2014-07-03 DIAGNOSIS — I8310 Varicose veins of unspecified lower extremity with inflammation: Secondary | ICD-10-CM | POA: Diagnosis not present

## 2014-07-03 DIAGNOSIS — D485 Neoplasm of uncertain behavior of skin: Secondary | ICD-10-CM | POA: Diagnosis not present

## 2014-07-03 DIAGNOSIS — D1801 Hemangioma of skin and subcutaneous tissue: Secondary | ICD-10-CM | POA: Diagnosis not present

## 2014-07-03 DIAGNOSIS — D2372 Other benign neoplasm of skin of left lower limb, including hip: Secondary | ICD-10-CM | POA: Diagnosis not present

## 2014-07-03 DIAGNOSIS — L820 Inflamed seborrheic keratosis: Secondary | ICD-10-CM | POA: Diagnosis not present

## 2014-07-29 ENCOUNTER — Other Ambulatory Visit: Payer: Self-pay | Admitting: Family Medicine

## 2014-08-29 DIAGNOSIS — H4011X2 Primary open-angle glaucoma, moderate stage: Secondary | ICD-10-CM | POA: Diagnosis not present

## 2014-11-25 ENCOUNTER — Telehealth: Payer: Self-pay | Admitting: General Practice

## 2014-11-25 NOTE — Telephone Encounter (Signed)
Called and spoke to pt in regards to his appt on Friday. Pt states that he is not having any problems, denies any pain on urination or with BM, no bleeding, asymptomatic. Pt states that his father had an issue with prostate cancer. He was reading an article somewhere that advised "if you have pains you should be evaluated". Pt though he should just be checked out, "since he has pains everywhere else". States he is not sure if he should be concerned. Pt was advised that his last PSA in December was normal. Please advise.

## 2014-11-25 NOTE — Telephone Encounter (Signed)
Without symptoms, insurance will not cover repeat PSA testing.  His PSA was normal <6 months ago.  No need for pt to have appt regarding prostate.  Please reassure him that this will be checked at each CPE

## 2014-11-26 NOTE — Telephone Encounter (Signed)
Pt notified, stated an understanding. Was ok with checking PSA at physicals. Pt was reassured that if any abnormalities he will be sent to specialist ASAP. Appt cancelled.

## 2014-11-28 ENCOUNTER — Ambulatory Visit: Payer: Medicare Other | Admitting: Family Medicine

## 2014-12-04 DIAGNOSIS — H524 Presbyopia: Secondary | ICD-10-CM | POA: Diagnosis not present

## 2014-12-04 DIAGNOSIS — H4011X4 Primary open-angle glaucoma, indeterminate stage: Secondary | ICD-10-CM | POA: Diagnosis not present

## 2014-12-17 ENCOUNTER — Encounter: Payer: Self-pay | Admitting: Family Medicine

## 2014-12-17 ENCOUNTER — Ambulatory Visit (INDEPENDENT_AMBULATORY_CARE_PROVIDER_SITE_OTHER): Payer: Medicare Other | Admitting: Family Medicine

## 2014-12-17 VITALS — BP 124/84 | HR 64 | Temp 97.9°F | Resp 16 | Wt 228.4 lb

## 2014-12-17 DIAGNOSIS — I10 Essential (primary) hypertension: Secondary | ICD-10-CM

## 2014-12-17 DIAGNOSIS — R5383 Other fatigue: Secondary | ICD-10-CM | POA: Diagnosis not present

## 2014-12-17 DIAGNOSIS — E785 Hyperlipidemia, unspecified: Secondary | ICD-10-CM

## 2014-12-17 LAB — HEPATIC FUNCTION PANEL
ALT: 14 U/L (ref 0–53)
AST: 14 U/L (ref 0–37)
Albumin: 4.1 g/dL (ref 3.5–5.2)
Alkaline Phosphatase: 52 U/L (ref 39–117)
BILIRUBIN TOTAL: 0.7 mg/dL (ref 0.2–1.2)
Bilirubin, Direct: 0.2 mg/dL (ref 0.0–0.3)
Total Protein: 6.7 g/dL (ref 6.0–8.3)

## 2014-12-17 LAB — CBC WITH DIFFERENTIAL/PLATELET
BASOS PCT: 0.4 % (ref 0.0–3.0)
Basophils Absolute: 0 10*3/uL (ref 0.0–0.1)
EOS PCT: 2.9 % (ref 0.0–5.0)
Eosinophils Absolute: 0.2 10*3/uL (ref 0.0–0.7)
HEMATOCRIT: 49 % (ref 39.0–52.0)
HEMOGLOBIN: 16.6 g/dL (ref 13.0–17.0)
LYMPHS PCT: 28.7 % (ref 12.0–46.0)
Lymphs Abs: 1.7 10*3/uL (ref 0.7–4.0)
MCHC: 33.9 g/dL (ref 30.0–36.0)
MCV: 91.2 fl (ref 78.0–100.0)
MONO ABS: 0.3 10*3/uL (ref 0.1–1.0)
Monocytes Relative: 5.2 % (ref 3.0–12.0)
NEUTROS PCT: 62.8 % (ref 43.0–77.0)
Neutro Abs: 3.7 10*3/uL (ref 1.4–7.7)
Platelets: 185 10*3/uL (ref 150.0–400.0)
RBC: 5.38 Mil/uL (ref 4.22–5.81)
RDW: 13.5 % (ref 11.5–15.5)
WBC: 6 10*3/uL (ref 4.0–10.5)

## 2014-12-17 LAB — BASIC METABOLIC PANEL
BUN: 22 mg/dL (ref 6–23)
CHLORIDE: 105 meq/L (ref 96–112)
CO2: 30 mEq/L (ref 19–32)
Calcium: 8.9 mg/dL (ref 8.4–10.5)
Creatinine, Ser: 0.88 mg/dL (ref 0.40–1.50)
GFR: 89.59 mL/min (ref >60.00)
Glucose, Bld: 102 mg/dL — ABNORMAL HIGH (ref 70–99)
Potassium: 4.1 mEq/L (ref 3.5–5.1)
Sodium: 138 mEq/L (ref 135–145)

## 2014-12-17 LAB — LIPID PANEL
Cholesterol: 152 mg/dL (ref 0–200)
HDL: 39.4 mg/dL (ref >39.00)
LDL Cholesterol: 100 mg/dL — ABNORMAL HIGH (ref 0–99)
NonHDL: 112.6
Total CHOL/HDL Ratio: 4
Triglycerides: 65 mg/dL (ref 0.0–149.0)
VLDL: 13 mg/dL (ref 0.0–40.0)

## 2014-12-17 LAB — TSH: TSH: 2.39 u[IU]/mL (ref 0.35–4.50)

## 2014-12-17 NOTE — Assessment & Plan Note (Signed)
New.  Pt's PE WNL.  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  And now that things have improved and they seem to be in a lull, things are likely catching up w/ him.  Check labs to r/o metabolic cause.  Reviewed supportive care and red flags that should prompt return.  Pt expressed understanding and is in agreement w/ plan.

## 2014-12-17 NOTE — Assessment & Plan Note (Signed)
Chronic problem.  Well controlled.  Asymptomatic w/ exception of fatigue.  Suspect that this is due to wife's recent illnesses and now that they are in a bit of a lull, things are catching up w/ him.  Will check labs to assess for underlying metabolic abnormality.  Reviewed supportive care and red flags that should prompt return.  Pt expressed understanding and is in agreement w/ plan.

## 2014-12-17 NOTE — Progress Notes (Signed)
   Subjective:    Patient ID: Jeremiah Merritt, male    DOB: 1940/07/30, 74 y.o.   MRN: [id number]  HPI HTN- well controlled on Amlodipine.  Denies CP, SOB, HAs, visual changes, edema.  + fatigue.  [facility].  Hyperlipidemia- chronic problem, on pravastatin daily.  Denies abd pain, N/V.  + myalgias, 'but i'm 73'.   Review of Systems For ROS see HPI     Objective:   Physical Exam  Constitutional: He is oriented to person, place, and time. He appears well-developed and well-nourished. No distress.  HENT:  Head: Normocephalic and atraumatic.  Eyes: Conjunctivae and EOM are normal. Pupils are equal, round, and reactive to light.  Neck: Normal range of motion. Neck supple. No thyromegaly present.  Cardiovascular: Normal rate, regular rhythm, normal heart sounds and intact distal pulses.   No murmur heard. Pulmonary/Chest: Effort normal and breath sounds normal. No respiratory distress.  Abdominal: Soft. Bowel sounds are normal. He exhibits no distension.  Musculoskeletal: He exhibits no edema.  Lymphadenopathy:    He has no cervical adenopathy.  Neurological: He is alert and oriented to person, place, and time. No cranial nerve deficit.  Skin: Skin is warm and dry.  Psychiatric: He has a normal mood and affect. His behavior is normal.  Vitals reviewed.         Assessment & Plan:

## 2014-12-17 NOTE — Assessment & Plan Note (Signed)
Chronic problem.  Tolerating statin w/o difficulty.  Check labs.  Adjust meds prn  

## 2014-12-17 NOTE — Patient Instructions (Signed)
Schedule your complete physical in 6 months We'll notify you of your lab results and make any changes if needed Keep up the good work on regular exercise- that's great! If the fatigue worsens, please let me know!  Otherwise, rest as needed Call with any questions or concerns Safe travels!!  Have fun!!!

## 2014-12-17 NOTE — Progress Notes (Signed)
Pre visit review using our clinic review tool, if applicable. No additional management support is needed unless otherwise documented below in the visit note. 

## 2014-12-23 ENCOUNTER — Other Ambulatory Visit: Payer: Self-pay | Admitting: General Practice

## 2014-12-23 MED ORDER — AMLODIPINE BESYLATE 10 MG PO TABS: 10.0000 mg | ORAL_TABLET | Freq: Every day | ORAL | Status: DC

## 2014-12-23 MED ORDER — PRAVASTATIN SODIUM 40 MG PO TABS: 40.0000 mg | ORAL_TABLET | Freq: Every day | ORAL | Status: DC

## 2015-04-02 DIAGNOSIS — H2513 Age-related nuclear cataract, bilateral: Secondary | ICD-10-CM | POA: Diagnosis not present

## 2015-04-02 DIAGNOSIS — H43813 Vitreous degeneration, bilateral: Secondary | ICD-10-CM | POA: Diagnosis not present

## 2015-04-02 DIAGNOSIS — H4011X1 Primary open-angle glaucoma, mild stage: Secondary | ICD-10-CM | POA: Diagnosis not present

## 2015-05-21 ENCOUNTER — Ambulatory Visit (INDEPENDENT_AMBULATORY_CARE_PROVIDER_SITE_OTHER): Payer: Medicare Other | Admitting: General Practice

## 2015-05-21 DIAGNOSIS — Z23 Encounter for immunization: Secondary | ICD-10-CM | POA: Diagnosis not present

## 2015-06-19 ENCOUNTER — Other Ambulatory Visit: Payer: Self-pay | Admitting: Family Medicine

## 2015-06-19 ENCOUNTER — Encounter: Payer: Self-pay | Admitting: Behavioral Health

## 2015-06-19 ENCOUNTER — Telehealth: Payer: Self-pay | Admitting: Behavioral Health

## 2015-06-19 NOTE — Telephone Encounter (Signed)
Pre-Visit Call completed with patient and chart updated.   Pre-Visit Info documented in Specialty Comments under [employer].    

## 2015-06-19 NOTE — Telephone Encounter (Signed)
Medication filled to pharmacy as requested.   

## 2015-06-22 ENCOUNTER — Encounter: Payer: Self-pay | Admitting: Family Medicine

## 2015-06-22 ENCOUNTER — Ambulatory Visit (INDEPENDENT_AMBULATORY_CARE_PROVIDER_SITE_OTHER): Payer: Medicare Other | Admitting: Family Medicine

## 2015-06-22 VITALS — BP 122/84 | HR 64 | Temp 98.3°F | Resp 16 | Ht 68.0 in | Wt 234.0 lb

## 2015-06-22 DIAGNOSIS — E669 Obesity, unspecified: Secondary | ICD-10-CM

## 2015-06-22 DIAGNOSIS — Z125 Encounter for screening for malignant neoplasm of prostate: Secondary | ICD-10-CM

## 2015-06-22 DIAGNOSIS — I10 Essential (primary) hypertension: Secondary | ICD-10-CM

## 2015-06-22 DIAGNOSIS — E785 Hyperlipidemia, unspecified: Secondary | ICD-10-CM | POA: Diagnosis not present

## 2015-06-22 DIAGNOSIS — Z0000 Encounter for general adult medical examination without abnormal findings: Secondary | ICD-10-CM | POA: Diagnosis not present

## 2015-06-22 LAB — BASIC METABOLIC PANEL
BUN: 25 mg/dL — ABNORMAL HIGH (ref 6–23)
CALCIUM: 9.1 mg/dL (ref 8.4–10.5)
CO2: 31 meq/L (ref 19–32)
CREATININE: 0.91 mg/dL (ref 0.40–1.50)
Chloride: 105 mEq/L (ref 96–112)
GFR: 86.07 mL/min (ref >60.00)
Glucose, Bld: 101 mg/dL — ABNORMAL HIGH (ref 70–99)
Potassium: 4.1 mEq/L (ref 3.5–5.1)
Sodium: 142 mEq/L (ref 135–145)

## 2015-06-22 LAB — PSA, MEDICARE: PSA: 1.31 ng/ml (ref 0.10–4.00)

## 2015-06-22 LAB — CBC WITH DIFFERENTIAL/PLATELET
BASOS ABS: 0 10*3/uL (ref 0.0–0.1)
Basophils Relative: 0.3 % (ref 0.0–3.0)
Eosinophils Absolute: 0.2 10*3/uL (ref 0.0–0.7)
Eosinophils Relative: 3.2 % (ref 0.0–5.0)
HEMATOCRIT: 50.2 % (ref 39.0–52.0)
HEMOGLOBIN: 16.8 g/dL (ref 13.0–17.0)
LYMPHS PCT: 31 % (ref 12.0–46.0)
Lymphs Abs: 2.1 10*3/uL (ref 0.7–4.0)
MCHC: 33.4 g/dL (ref 30.0–36.0)
MCV: 91.5 fl (ref 78.0–100.0)
MONOS PCT: 7.2 % (ref 3.0–12.0)
Monocytes Absolute: 0.5 10*3/uL (ref 0.1–1.0)
NEUTROS ABS: 3.9 10*3/uL (ref 1.4–7.7)
Neutrophils Relative %: 58.3 % (ref 43.0–77.0)
PLATELETS: 186 10*3/uL (ref 150.0–400.0)
RBC: 5.48 Mil/uL (ref 4.22–5.81)
RDW: 14 % (ref 11.5–15.5)
WBC: 6.7 10*3/uL (ref 4.0–10.5)

## 2015-06-22 LAB — LIPID PANEL
CHOLESTEROL: 156 mg/dL (ref 0–200)
HDL: 34.8 mg/dL — AB (ref >39.00)
LDL Cholesterol: 106 mg/dL — ABNORMAL HIGH (ref 0–99)
NonHDL: 120.92
Total CHOL/HDL Ratio: 4
Triglycerides: 76 mg/dL (ref 0.0–149.0)
VLDL: 15.2 mg/dL (ref 0.0–40.0)

## 2015-06-22 LAB — HEPATIC FUNCTION PANEL
ALT: 16 U/L (ref 0–53)
AST: 14 U/L (ref 0–37)
Albumin: 4.2 g/dL (ref 3.5–5.2)
Alkaline Phosphatase: 57 U/L (ref 39–117)
BILIRUBIN DIRECT: 0.2 mg/dL (ref 0.0–0.3)
TOTAL PROTEIN: 6.5 g/dL (ref 6.0–8.3)
Total Bilirubin: 0.9 mg/dL (ref 0.2–1.2)

## 2015-06-22 LAB — TSH: TSH: 2.1 u[IU]/mL (ref 0.35–4.50)

## 2015-06-22 NOTE — Assessment & Plan Note (Signed)
Pt's PE unchanged from previous.  UTD on colonoscopy- not due until 2020.  Pt declined GU exam today.  UTD on immunizations.  Check labs.  Anticipatory guidance provided.

## 2015-06-22 NOTE — Patient Instructions (Signed)
Follow up in 6 months to recheck BP and cholesterol We'll notify you of your lab results and make any changes if needed Continue to work on healthy diet and regular exercise- you can do it! You are up to date on colonoscopy until 2020- yay!!! Call with any questions or concerns Happy Holidays!!!

## 2015-06-22 NOTE — Assessment & Plan Note (Signed)
Chronic problem.  Pt continues to gain weight.  Stressed need for healthy diet and regular exercise.  Check labs to risk stratify.  Will continue to follow.

## 2015-06-22 NOTE — Progress Notes (Signed)
   Subjective:    Patient ID: Jeremiah Merritt, male    DOB: 06/12/1941, 74 y.o.   MRN: [id number]  HPI Here today for CPE.  Risk Factors: HTN- chronic problem, well controlled on Amlodipine.  Denies CP, SOB, HAs, visual changes, edema. Hyperlipidemia- chronic problem, on Pravastatin.  Denies abd pain, N/V, myalgias. Obesity- pt has gained 6 lbs since June Physical Activity: limited due to wife's medical issues. Fall Risk: low Depression: denies current sxs Hearing: normal to conversational tones, mildly decreased to whispered voice ADL's: independent Cognitive: normal linear thought process, memory and attention intact Home Safety: safe at home Height, Weight, BMI, Visual Acuity: see vitals, vision corrected to 20/20 w/ glasses Counseling:  UTD on colonoscopy (due 2020- [facility]).  UTD on vaccines Care team reviewed and updated Labs Ordered: See A&P Care Plan: See A&P    Review of Systems Patient reports no vision/hearing changes, anorexia, fever ,adenopathy, persistant/recurrent hoarseness, swallowing issues, chest pain, palpitations, edema, persistant/recurrent cough, hemoptysis, dyspnea (rest,exertional, paroxysmal nocturnal), gastrointestinal  bleeding (melena, rectal bleeding), abdominal pain, excessive heart burn, GU symptoms (dysuria, hematuria, voiding/incontinence issues) syncope, focal weakness, memory loss, numbness & tingling, skin/hair/nail changes, depression, anxiety, abnormal bruising/bleeding, musculoskeletal symptoms/signs.     Objective:   Physical Exam General Appearance:    Alert, cooperative, no distress, appears stated age  Head:    Normocephalic, without obvious abnormality, atraumatic  Eyes:    PERRL, conjunctiva/corneas clear, EOM's intact, fundi    benign, both eyes       Ears:    Normal TM's and external ear canals, both ears  Nose:   Nares normal, septum midline, mucosa normal, no drainage   or sinus tenderness  Throat:   Lips, mucosa, and tongue normal;  teeth and gums normal  Neck:   Supple, symmetrical, trachea midline, no adenopathy;       thyroid:  No enlargement/tenderness/nodules  Back:     Symmetric, no curvature, ROM normal, no CVA tenderness  Lungs:     Clear to auscultation bilaterally, respirations unlabored  Chest wall:    No tenderness or deformity  Heart:    Regular rate and rhythm, S1 and S2 normal, no murmur, rub   or gallop  Abdomen:     Soft, non-tender, bowel sounds active all four quadrants,    no masses, no organomegaly  Genitalia:    Deferred at pt's request  Rectal:    Extremities:   Extremities normal, atraumatic, no cyanosis or edema  Pulses:   2+ and symmetric all extremities  Skin:   Skin color, texture, turgor normal, no rashes or lesions  Lymph nodes:   Cervical, supraclavicular, and axillary nodes normal  Neurologic:   CNII-XII intact. Normal strength, sensation and reflexes      throughout          Assessment & Plan:

## 2015-06-22 NOTE — Assessment & Plan Note (Signed)
Chronic problem.  Well controlled on Amlodipine.  Asymptomatic.  Check labs.  No med changes at this time.  Will follow.

## 2015-06-22 NOTE — Progress Notes (Signed)
Pre visit review using our clinic review tool, if applicable. No additional management support is needed unless otherwise documented below in the visit note. 

## 2015-06-22 NOTE — Assessment & Plan Note (Signed)
Chronic problem.  Tolerating statin w/o difficulty.  Encouraged healthy diet and regular exercise.  Check labs.  Adjust meds prn. 

## 2015-07-15 ENCOUNTER — Encounter: Payer: Self-pay | Admitting: *Deleted

## 2015-08-10 DIAGNOSIS — H401131 Primary open-angle glaucoma, bilateral, mild stage: Secondary | ICD-10-CM | POA: Diagnosis not present

## 2015-08-10 DIAGNOSIS — H43812 Vitreous degeneration, left eye: Secondary | ICD-10-CM | POA: Diagnosis not present

## 2015-08-10 DIAGNOSIS — H2513 Age-related nuclear cataract, bilateral: Secondary | ICD-10-CM | POA: Diagnosis not present

## 2015-10-06 ENCOUNTER — Other Ambulatory Visit: Payer: Self-pay | Admitting: Family Medicine

## 2015-10-06 ENCOUNTER — Ambulatory Visit (HOSPITAL_BASED_OUTPATIENT_CLINIC_OR_DEPARTMENT_OTHER)
Admission: RE | Admit: 2015-10-06 | Discharge: 2015-10-06 | Disposition: A | Discharge status: Home / Self Care | Payer: Medicare Other | Source: Ambulatory Visit | Attending: Family Medicine | Admitting: Family Medicine

## 2015-10-06 ENCOUNTER — Ambulatory Visit (INDEPENDENT_AMBULATORY_CARE_PROVIDER_SITE_OTHER): Payer: Medicare Other | Admitting: Family Medicine

## 2015-10-06 ENCOUNTER — Encounter: Payer: Self-pay | Admitting: Family Medicine

## 2015-10-06 VITALS — BP 140/84 | HR 66 | Temp 99.4°F | Wt 235.2 lb

## 2015-10-06 DIAGNOSIS — R600 Localized edema: Secondary | ICD-10-CM

## 2015-10-06 DIAGNOSIS — R52 Pain, unspecified: Secondary | ICD-10-CM

## 2015-10-06 DIAGNOSIS — J111 Influenza due to unidentified influenza virus with other respiratory manifestations: Secondary | ICD-10-CM | POA: Diagnosis not present

## 2015-10-06 DIAGNOSIS — M7989 Other specified soft tissue disorders: Secondary | ICD-10-CM | POA: Diagnosis not present

## 2015-10-06 MED ORDER — OSELTAMIVIR PHOSPHATE 75 MG PO CAPS: 75.0000 mg | ORAL_CAPSULE | Freq: Two times a day (BID) | ORAL | Status: DC

## 2015-10-06 MED ORDER — FUROSEMIDE 20 MG PO TABS: 20.0000 mg | ORAL_TABLET | Freq: Every day | ORAL | Status: DC

## 2015-10-06 NOTE — Progress Notes (Signed)
Pre visit review using our clinic review tool, if applicable. No additional management support is needed unless otherwise documented below in the visit note. 

## 2015-10-06 NOTE — Progress Notes (Signed)
Patient ID: Jeremiah Merritt, male    DOB: 1940/10/12  Age: 75 y.o. MRN: [id number]    Subjective:  Subjective HPI Jeremiah Merritt presents for uri symptoms, fever and bodyachesx1 day.  His wife was dx with flu today.  He is also c/o R calf pain and swelling x few days.  They just got back from long trip 12 hours in the car -- no cp, no sob.    Review of Systems  Constitutional: Positive for fever and chills.  HENT: Positive for rhinorrhea. Negative for congestion, postnasal drip and sinus pressure.   Respiratory: Negative for cough, chest tightness, shortness of breath and wheezing.   Cardiovascular: Positive for leg swelling. Negative for chest pain and palpitations.  Musculoskeletal: Positive for myalgias. Negative for back pain, neck pain and neck stiffness.  Allergic/Immunologic: Negative for environmental allergies.  Neurological: Negative for seizures, light-headedness, numbness and headaches.  Psychiatric/Behavioral: Negative for dysphoric mood and decreased concentration. The patient is not nervous/anxious.     History Past Medical History  Diagnosis Date  . HYPERLIPIDEMIA 12/08/2008  . ERECTILE DYSFUNCTION 12/08/2008  . HYPERTENSION 12/08/2008  . COLONIC POLYPS, HX OF 02/27/2009    He has past surgical history that includes Tonsillectomy.   His family history includes Cancer in his other.He reports that he has never smoked. He does not have any smokeless tobacco history on file. His alcohol and drug histories are not on file.  Current Outpatient Prescriptions on File Prior to Visit  Medication Sig Dispense Refill  . acetaminophen (TYLENOL) 500 MG tablet Take 1,000 mg by mouth every morning.    Marland Kitchen amLODipine (NORVASC) 10 MG tablet TAKE ONE TABLET BY MOUTH ONCE DAILY 90 tablet 1  . latanoprost (XALATAN) 0.005 % ophthalmic solution Place 1 drop into both eyes at bedtime.     . pravastatin (PRAVACHOL) 40 MG tablet TAKE ONE TABLET BY MOUTH ONCE DAILY 90 tablet 1  . aspirin EC 81  MG tablet Take 81 mg by mouth every other day. Reported on 10/06/2015     No current facility-administered medications on file prior to visit.     Objective:  Objective Physical Exam  Constitutional: He is oriented to person, place, and time. Vital signs are normal. He appears well-developed and well-nourished. He is sleeping.  HENT:  Head: Normocephalic and atraumatic.  Nose: Rhinorrhea present. Right sinus exhibits no maxillary sinus tenderness and no frontal sinus tenderness. Left sinus exhibits no maxillary sinus tenderness and no frontal sinus tenderness.  Mouth/Throat: Posterior oropharyngeal erythema present. No oropharyngeal exudate or posterior oropharyngeal edema.  Eyes: EOM are normal. Pupils are equal, round, and reactive to light.  Neck: Normal range of motion. Neck supple. No thyromegaly present.  Cardiovascular: Normal rate and regular rhythm.   No murmur heard. Pulmonary/Chest: Effort normal and breath sounds normal. No respiratory distress. He has no wheezes. He has no rales. He exhibits no tenderness.  Musculoskeletal: He exhibits edema and tenderness.  Neurological: He is alert and oriented to person, place, and time.  Skin: Skin is warm and dry.  Psychiatric: He has a normal mood and affect. His behavior is normal. Judgment and thought content normal.  Nursing note and vitals reviewed.  BP 140/84 mmHg  Pulse 66  Temp(Src) 99.4 F (37.4 C) (Oral)  Wt 235 lb 3.2 oz (106.686 kg)  SpO2 95% Wt Readings from Last 3 Encounters:  10/06/15 235 lb 3.2 oz (106.686 kg)  06/22/15 234 lb (106.142 kg)  12/17/14 228 lb 6 oz (103.59  kg)     Lab Results  Component Value Date   WBC 6.7 06/22/2015   HGB 16.8 06/22/2015   HCT 50.2 06/22/2015   PLT 186.0 06/22/2015   GLUCOSE 101* 06/22/2015   CHOL 156 06/22/2015   TRIG 76.0 06/22/2015   HDL 34.80* 06/22/2015   LDLCALC 106* 06/22/2015   ALT 16 06/22/2015   AST 14 06/22/2015   NA 142 06/22/2015   K 4.1 06/22/2015   CL  105 06/22/2015   CREATININE 0.91 06/22/2015   BUN 25* 06/22/2015   CO2 31 06/22/2015   TSH 2.10 06/22/2015   PSA 1.31 06/22/2015    No results found.   Assessment & Plan:  Plan I am having Mr. Kasim start on oseltamivir and furosemide. I am also having him maintain his latanoprost, acetaminophen, aspirin EC, amLODipine, and pravastatin.  Meds ordered this encounter  Medications  . oseltamivir (TAMIFLU) 75 MG capsule    Sig: Take 1 capsule (75 mg total) by mouth 2 (two) times daily.    Dispense:  10 capsule    Refill:  0  . furosemide (LASIX) 20 MG tablet    Sig: Take 1 tablet (20 mg total) by mouth daily.    Dispense:  30 tablet    Refill:  3    Problem List Items Addressed This Visit      Unprioritized   Edema of right lower extremity    Hx dvt per pt Check doppler  Lasix for edema F/u pcp        Other Visit Diagnoses    Body aches    -  Primary    Relevant Orders    POC Influenza A&B (Binax test)    Influenza with respiratory manifestation        Relevant Medications    oseltamivir (TAMIFLU) 75 MG capsule    Edema of lower extremity, unspecified laterality        Relevant Medications    furosemide (LASIX) 20 MG tablet       Follow-up: Return if symptoms worsen or fail to improve.  Garnet Koyanagi, DO

## 2015-10-06 NOTE — Patient Instructions (Signed)
Influenza Tests WHY AM I HAVING THIS TEST? You may have an influenza test to help your health care provider determine what type of respiratory infection you have. The test may also be used to help determine a treatment plan and to monitor influenza activity within a community. There are two types of influenza virus: types A and B. Often, one strain of type A influenza will be the most common type of influenza in a community during flu season. This is typically between the months of October and May. Influenza tests can help determine which strain of influenza type A is occurring most often in the community. WHAT KIND OF SAMPLE IS TAKEN? Influenza tests are performed by collecting a small sample of fluids (secretions) from your nose or throat using a cotton swab. Tests performed on nasal secretions are more accurate than tests performed on a sample taken from your throat.  Rapid influenza tests are available and have become the most frequently used tests for influenza. They are most accurate when completed within the first 48 hours after your symptoms begin.  Depending on the method, a rapid influenza test may be completed in your health care provider's office in less than 30 minutes. It can also be sent to a lab with the results available the same day.  Depending on the particular type of test used, it can identify influenza type A, a mixture of types A and B, or differentiate between type A and B.  Another test that your health care provider may order is a viral culture. This also requires the collection of secretions from your nose or throat. The sample is then sent to a lab for processing. This may take several days to complete. HOW ARE YOUR TEST RESULTS REPORTED? Your test results will be reported as either positive or negative. A false-negative result can occur. A false-negative result is incorrect because it indicates a condition or finding is not present when it is. It is your responsibility to  obtain your test results. Ask the lab or department performing the test when and how you will get your results. WHAT DO THE RESULTS MEAN?  A positive test means you have influenza. Tests may further determine the type of influenza you have.  A negative influenza test result means it is not likely that you have influenza.  A false-negative result can occur. False-negative results are more likely to happen at the height of the influenza season. Talk with your health care provider to discuss your results, treatment options, and if necessary, the need for more tests. Talk with your health care provider if you have any questions about your results.   This information is not intended to replace advice given to you by your health care provider. Make sure you discuss any questions you have with your health care provider.   Document Released: 04/13/2005 Document Revised: 07/25/2014 Document Reviewed: 11/20/2013 Elsevier Interactive Patient Education 2016 [employer]  

## 2015-10-07 DIAGNOSIS — R2240 Localized swelling, mass and lump, unspecified lower limb: Secondary | ICD-10-CM | POA: Insufficient documentation

## 2015-10-07 NOTE — Assessment & Plan Note (Signed)
Hx dvt per pt Check doppler  Lasix for edema F/u pcp

## 2015-10-19 DIAGNOSIS — L821 Other seborrheic keratosis: Secondary | ICD-10-CM | POA: Diagnosis not present

## 2015-10-19 DIAGNOSIS — D225 Melanocytic nevi of trunk: Secondary | ICD-10-CM | POA: Diagnosis not present

## 2015-10-19 DIAGNOSIS — Z86018 Personal history of other benign neoplasm: Secondary | ICD-10-CM | POA: Diagnosis not present

## 2015-10-19 DIAGNOSIS — I872 Venous insufficiency (chronic) (peripheral): Secondary | ICD-10-CM | POA: Diagnosis not present

## 2015-12-10 DIAGNOSIS — H43812 Vitreous degeneration, left eye: Secondary | ICD-10-CM | POA: Diagnosis not present

## 2015-12-10 DIAGNOSIS — H401131 Primary open-angle glaucoma, bilateral, mild stage: Secondary | ICD-10-CM | POA: Diagnosis not present

## 2015-12-10 DIAGNOSIS — H2513 Age-related nuclear cataract, bilateral: Secondary | ICD-10-CM | POA: Diagnosis not present

## 2015-12-21 ENCOUNTER — Other Ambulatory Visit: Payer: Self-pay | Admitting: Family Medicine

## 2015-12-21 ENCOUNTER — Ambulatory Visit (INDEPENDENT_AMBULATORY_CARE_PROVIDER_SITE_OTHER): Payer: Medicare Other | Admitting: Family Medicine

## 2015-12-21 ENCOUNTER — Encounter: Payer: Self-pay | Admitting: Family Medicine

## 2015-12-21 ENCOUNTER — Other Ambulatory Visit (INDEPENDENT_AMBULATORY_CARE_PROVIDER_SITE_OTHER): Payer: Medicare Other

## 2015-12-21 VITALS — BP 128/78 | HR 72 | Temp 98.0°F | Resp 18 | Ht 68.0 in | Wt 230.1 lb

## 2015-12-21 DIAGNOSIS — R2240 Localized swelling, mass and lump, unspecified lower limb: Secondary | ICD-10-CM

## 2015-12-21 DIAGNOSIS — R600 Localized edema: Secondary | ICD-10-CM

## 2015-12-21 DIAGNOSIS — J069 Acute upper respiratory infection, unspecified: Secondary | ICD-10-CM | POA: Insufficient documentation

## 2015-12-21 DIAGNOSIS — E785 Hyperlipidemia, unspecified: Secondary | ICD-10-CM

## 2015-12-21 DIAGNOSIS — I10 Essential (primary) hypertension: Secondary | ICD-10-CM

## 2015-12-21 LAB — BASIC METABOLIC PANEL
BUN: 17 mg/dL (ref 6–23)
CHLORIDE: 103 meq/L (ref 96–112)
CO2: 31 mEq/L (ref 19–32)
CREATININE: 0.91 mg/dL (ref 0.40–1.50)
Calcium: 9 mg/dL (ref 8.4–10.5)
GFR: 85.96 mL/min (ref >60.00)
GLUCOSE: 98 mg/dL (ref 70–99)
Potassium: 3.9 mEq/L (ref 3.5–5.1)
Sodium: 140 mEq/L (ref 135–145)

## 2015-12-21 LAB — CBC WITH DIFFERENTIAL/PLATELET
BASOS ABS: 0 10*3/uL (ref 0.0–0.1)
BASOS PCT: 0.4 % (ref 0.0–3.0)
EOS ABS: 0.2 10*3/uL (ref 0.0–0.7)
Eosinophils Relative: 4.4 % (ref 0.0–5.0)
HEMATOCRIT: 49.2 % (ref 39.0–52.0)
HEMOGLOBIN: 16.4 g/dL (ref 13.0–17.0)
LYMPHS PCT: 25.2 % (ref 12.0–46.0)
Lymphs Abs: 1.4 10*3/uL (ref 0.7–4.0)
MCHC: 33.4 g/dL (ref 30.0–36.0)
MCV: 90.7 fl (ref 78.0–100.0)
MONO ABS: 0.5 10*3/uL (ref 0.1–1.0)
Monocytes Relative: 9.7 % (ref 3.0–12.0)
Neutro Abs: 3.2 10*3/uL (ref 1.4–7.7)
Neutrophils Relative %: 60.3 % (ref 43.0–77.0)
Platelets: 183 10*3/uL (ref 150.0–400.0)
RBC: 5.43 Mil/uL (ref 4.22–5.81)
RDW: 13.2 % (ref 11.5–15.5)
WBC: 5.4 10*3/uL (ref 4.0–10.5)

## 2015-12-21 LAB — HEPATIC FUNCTION PANEL
ALBUMIN: 4.4 g/dL (ref 3.5–5.2)
ALT: 16 U/L (ref 0–53)
AST: 17 U/L (ref 0–37)
Alkaline Phosphatase: 60 U/L (ref 39–117)
BILIRUBIN TOTAL: 0.8 mg/dL (ref 0.2–1.2)
Bilirubin, Direct: 0.2 mg/dL (ref 0.0–0.3)
TOTAL PROTEIN: 6.8 g/dL (ref 6.0–8.3)

## 2015-12-21 LAB — LIPID PANEL
CHOL/HDL RATIO: 5
CHOLESTEROL: 160 mg/dL (ref 0–200)
HDL: 35 mg/dL — AB (ref >39.00)
LDL Cholesterol: 108 mg/dL — ABNORMAL HIGH (ref 0–99)
NonHDL: 124.82
TRIGLYCERIDES: 82 mg/dL (ref 0.0–149.0)
VLDL: 16.4 mg/dL (ref 0.0–40.0)

## 2015-12-21 MED ORDER — AMLODIPINE BESYLATE 10 MG PO TABS: 10.0000 mg | ORAL_TABLET | Freq: Every day | ORAL | Status: DC

## 2015-12-21 MED ORDER — PRAVASTATIN SODIUM 40 MG PO TABS: 40.0000 mg | ORAL_TABLET | Freq: Every day | ORAL | Status: DC

## 2015-12-21 NOTE — Progress Notes (Signed)
Pre visit review using our clinic review tool, if applicable. No additional management support is needed unless otherwise documented below in the visit note. 

## 2015-12-21 NOTE — Patient Instructions (Signed)
Schedule your complete physical in 6 months Please go have your labs done at [facility] notify you of your lab results and make any changes if needed Continue to work on healthy diet and regular exercise- you're doing great! Restart the Lasix (Furosemide) daily to improve your swelling.  If no improvement, let me know! Your respiratory symptoms are most likely viral as I don't see a bacterial source.  No need for antibiotics but please your Robitussin or Delsym as needed for cough, drink plenty of fluids, rest! Call with any questions or concerns Hang in there!

## 2015-12-21 NOTE — Telephone Encounter (Signed)
Medication filled to pharmacy as requested.   

## 2015-12-21 NOTE — Progress Notes (Signed)
   Subjective:    Patient ID: Jeremiah Merritt, male    DOB: Jan 11, 1941, 75 y.o.   MRN: [id number]  HPI HTN- chronic problem, well controlled on Lasix, Amlodipine.  No CP, SOB, HAs, visual changes.  Hyperlipidemia- chronic problem, on Pravastatin.  Pt has lost 4 lbs since last visit.  Pt is attempting to exercise more regularly.  No abd pain, N/V.  URI- sxs started 2 days ago w/ nasal congestion and drainage.  Denies sore throat but does have hoarseness.  No fevers.  Mild cough- not productive.  Denies sinus pain/pressure.  No ear pain.  No known sick contacts.  L lower leg swelling- pt has hx of chronic edema.  Not currently taking Lasix.  Leg is swollen and tight.  Painful to wear compression socks at this time.   Review of Systems For ROS see HPI     Objective:   Physical Exam  Constitutional: He is oriented to person, place, and time. He appears well-developed and well-nourished. No distress.  HENT:  Head: Normocephalic and atraumatic.  Eyes: Conjunctivae and EOM are normal. Pupils are equal, round, and reactive to light.  Neck: Normal range of motion. Neck supple. No thyromegaly present.  Cardiovascular: Normal rate, regular rhythm, normal heart sounds and intact distal pulses.   No murmur heard. Pulmonary/Chest: Effort normal and breath sounds normal. No respiratory distress.  Abdominal: Soft. Bowel sounds are normal. He exhibits no distension.  Musculoskeletal: He exhibits edema (L lower leg swelling w/ tightness, 1+ pitting edema to mid shin).  Lymphadenopathy:    He has no cervical adenopathy.  Neurological: He is alert and oriented to person, place, and time. No cranial nerve deficit.  Skin: Skin is warm and dry.  Psychiatric: He has a normal mood and affect. His behavior is normal.  Vitals reviewed.         Assessment & Plan:

## 2015-12-22 ENCOUNTER — Other Ambulatory Visit: Payer: Self-pay | Admitting: General Practice

## 2015-12-22 DIAGNOSIS — R600 Localized edema: Secondary | ICD-10-CM

## 2015-12-22 MED ORDER — FUROSEMIDE 20 MG PO TABS: 20.0000 mg | ORAL_TABLET | Freq: Every day | ORAL | Status: DC

## 2015-12-22 NOTE — Assessment & Plan Note (Signed)
New.  No evidence of bacterial infxn.  No need for abx.  Reviewed supportive care and red flags that should prompt return.  Pt expressed understanding and is in agreement w/ plan.

## 2015-12-22 NOTE — Assessment & Plan Note (Signed)
Chronic problem, adequate control on current meds.  Asymptomatic.  Check labs.  No anticipated med changes.

## 2015-12-22 NOTE — Assessment & Plan Note (Signed)
Chronic problem.  Tolerating statin w/o difficulty.  Check labs.  Adjust meds prn  

## 2015-12-22 NOTE — Assessment & Plan Note (Signed)
New to provider, ongoing for pt.  Pt has hx of surgery in this leg.  He was started on Lasix by Dr Etter Sjogren but he admits that he is not taking this.  Supposed to be wearing compression hose but w/ his current level of swelling, these are too tight.  Start lasix.  Once swelling is down, restart compression hose.  Pt expressed understanding and is in agreement w/ plan.

## 2015-12-30 ENCOUNTER — Encounter: Payer: Self-pay | Admitting: Family Medicine

## 2015-12-30 ENCOUNTER — Ambulatory Visit (INDEPENDENT_AMBULATORY_CARE_PROVIDER_SITE_OTHER): Payer: Medicare Other | Admitting: Family Medicine

## 2015-12-30 VITALS — BP 126/80 | HR 80 | Temp 98.0°F | Resp 17 | Ht 68.0 in | Wt 229.0 lb

## 2015-12-30 DIAGNOSIS — J302 Other seasonal allergic rhinitis: Secondary | ICD-10-CM

## 2015-12-30 DIAGNOSIS — J309 Allergic rhinitis, unspecified: Secondary | ICD-10-CM | POA: Insufficient documentation

## 2015-12-30 MED ORDER — FLUTICASONE PROPIONATE 50 MCG/ACT NA SUSP: 2.0000 | Freq: Every day | NASAL | Status: DC

## 2015-12-30 NOTE — Progress Notes (Signed)
Pre visit review using our clinic review tool, if applicable. No additional management support is needed unless otherwise documented below in the visit note. 

## 2015-12-30 NOTE — Patient Instructions (Signed)
Follow up as needed The cough is due to your post-nasal drip Drink plenty of fluids Start daily Claritin or Zyrtec (store brand is just as good) for the allergy drainage Add the Flonase- 2 sprays each nostril daily Call with any questions or concerns Hang in there!!

## 2015-12-30 NOTE — Progress Notes (Signed)
   Subjective:    Patient ID: Jeremiah Merritt, male    DOB: 09-Nov-1940, 75 y.o.   MRN: [id number]  HPI Cough- pt has had cough since his last visit on 6/5.  No fevers.  Cough is not productive.  No sinus pain/pressure.  No ear pain.  Taking cough medicine w/o improvement.  Pt is able to sleep through the cough.  No N/V.  No SOB.  No wheezing.     Review of Systems For ROS see HPI     Objective:   Physical Exam  Constitutional: He appears well-developed and well-nourished. No distress.  HENT:  Head: Normocephalic and atraumatic.  No TTP over sinuses + turbinate edema + PND TMs normal bilaterally  Eyes: Conjunctivae and EOM are normal. Pupils are equal, round, and reactive to light.  Neck: Normal range of motion. Neck supple.  Cardiovascular: Normal rate, regular rhythm and normal heart sounds.   Pulmonary/Chest: Effort normal and breath sounds normal. No respiratory distress. He has no wheezes.  Dry cough  Lymphadenopathy:    He has no cervical adenopathy.  Skin: Skin is warm and dry.  Vitals reviewed.         Assessment & Plan:

## 2015-12-30 NOTE — Assessment & Plan Note (Signed)
New.  Pt's cough is consistent w/ PND and airway irritation.  Lungs CTAB.  Pt reports feeling well w/ exception of cough.  No need for abx.  Start daily antihistamine and nasal steroid.  Reviewed supportive care and red flags that should prompt return.  Pt expressed understanding and is in agreement w/ plan.

## 2016-03-22 ENCOUNTER — Ambulatory Visit (INDEPENDENT_AMBULATORY_CARE_PROVIDER_SITE_OTHER): Payer: Medicare Other | Admitting: Family Medicine

## 2016-03-22 ENCOUNTER — Encounter: Payer: Self-pay | Admitting: Family Medicine

## 2016-03-22 VITALS — BP 124/83 | HR 60 | Temp 98.1°F | Resp 16 | Ht 68.0 in | Wt 232.1 lb

## 2016-03-22 DIAGNOSIS — R109 Unspecified abdominal pain: Secondary | ICD-10-CM | POA: Diagnosis not present

## 2016-03-22 LAB — POCT URINALYSIS DIPSTICK
BILIRUBIN UA: NEGATIVE
Blood, UA: NEGATIVE
GLUCOSE UA: NEGATIVE
Ketones, UA: NEGATIVE
LEUKOCYTES UA: NEGATIVE
NITRITE UA: NEGATIVE
PH UA: 6.5
Protein, UA: NEGATIVE
Spec Grav, UA: 1.015
UROBILINOGEN UA: 1

## 2016-03-22 MED ORDER — MELOXICAM 15 MG PO TABS: 15.0000 mg | ORAL_TABLET | Freq: Every day | ORAL | 0 refills | Status: DC

## 2016-03-22 NOTE — Progress Notes (Signed)
   Subjective:    Patient ID: Jeremiah Merritt, male    DOB: 1941-03-06, 75 y.o.   MRN: [id number]  HPI R flank pain- pt was working on his patio this summer.  sxs started ~2 months ago.  Discomfort is present all the time but pain will wax and wane depending on activity.  Pain worsened this weekend after picking up granddaughters repeatedly.  No burning or blood w/ urination.  Resting improves pain but doesn't resolve it.  Pain is worse w/ activity.   Review of Systems For ROS see HPI     Objective:   Physical Exam  Constitutional: He is oriented to person, place, and time. He appears well-developed and well-nourished. No distress.  HENT:  Head: Normocephalic and atraumatic.  Abdominal: He exhibits no distension. There is no tenderness. There is no rebound and no guarding.  Musculoskeletal: He exhibits no edema or tenderness (no TTP over R flank).  Pain over R lat w/ rotation and lifting No CVA tenderness bilaterally  Neurological: He is alert and oriented to person, place, and time.  Skin: Skin is warm and dry. No rash noted. No erythema.  Psychiatric: He has a normal mood and affect. His behavior is normal. Thought content normal.  Vitals reviewed.         Assessment & Plan:  R Flank Pain- suspect that this is musculoskeletal in nature.  UA is not consistent w/ infxn or stone.  Pain is reproducible by rotation and certain movements but no TTP.  Start scheduled NSAIDs, heating pad.  Reviewed supportive care and red flags that should prompt return.  Pt expressed understanding and is in agreement w/ plan.

## 2016-03-22 NOTE — Progress Notes (Signed)
Pre visit review using our clinic review tool, if applicable. No additional management support is needed unless otherwise documented below in the visit note. 

## 2016-03-22 NOTE — Patient Instructions (Signed)
Follow up as needed Start the Meloxicam once daily- take w/ food- for 10-14 days and then as needed Continue your tylenol daily Use a heating pad for pain relief Try and avoid lifting for the next 1-2 weeks to allow the muscle time to heal Call with any questions or concerns- particularly if no improvement Hang in there!!!

## 2016-03-30 ENCOUNTER — Encounter: Payer: Self-pay | Admitting: Family Medicine

## 2016-04-01 ENCOUNTER — Ambulatory Visit (INDEPENDENT_AMBULATORY_CARE_PROVIDER_SITE_OTHER): Payer: Medicare Other | Admitting: Behavioral Health

## 2016-04-01 DIAGNOSIS — Z23 Encounter for immunization: Secondary | ICD-10-CM | POA: Diagnosis not present

## 2016-04-01 NOTE — Progress Notes (Addendum)
Pre visit review using our clinic review tool, if applicable. No additional management support is needed unless otherwise documented below in the visit note.  Patient in office today for Influenza vaccination. IM given in Right Deltoid. Patient tolerated injection well.

## 2016-04-04 ENCOUNTER — Encounter: Payer: Self-pay | Admitting: Family Medicine

## 2016-04-04 ENCOUNTER — Ambulatory Visit (INDEPENDENT_AMBULATORY_CARE_PROVIDER_SITE_OTHER): Payer: Medicare Other | Admitting: Family Medicine

## 2016-04-04 VITALS — BP 120/64 | HR 62 | Temp 98.0°F | Resp 16 | Ht 68.0 in | Wt 232.4 lb

## 2016-04-04 DIAGNOSIS — M549 Dorsalgia, unspecified: Secondary | ICD-10-CM

## 2016-04-04 NOTE — Progress Notes (Signed)
Pre visit review using our clinic review tool, if applicable. No additional management support is needed unless otherwise documented below in the visit note. 

## 2016-04-04 NOTE — Patient Instructions (Signed)
Follow up as needed We'll call you with your PT appt for the back pain Resume walking and regular activity- ease back into it Alternate ice/heat for the discomfort Take the Meloxicam as needed for pain (take w/ food) Call with any questions or concerns Hang in there!!!

## 2016-04-04 NOTE — Progress Notes (Signed)
   Subjective:    Patient ID: Jeremiah Merritt, male    DOB: 1941-07-05, 75 y.o.   MRN: [id number]  HPI Back pain- pt was seen on 9/5 w/ similar.  Pt has been taking Mobic w/ minimal relief.  Pt reports pain was 'pretty bad' last night.  Pt did not do any physical activity or lifting this weekend.  sxs started when he was putting in his patio this summer.  Pain will radiate w/ certain movements.  Asymptomatic today.  Pt reports pain will 'come and go'.     Review of Systems For ROS see HPI     Objective:   Physical Exam  Constitutional: He is oriented to person, place, and time. He appears well-developed and well-nourished. No distress.  HENT:  Head: Normocephalic and atraumatic.  Musculoskeletal: He exhibits no edema, tenderness (no TTP over thoracic spine or paraspinal muscles) or deformity.  Neurological: He is alert and oriented to person, place, and time. No cranial nerve deficit. Coordination normal.  Skin: Skin is warm and dry. No erythema.  Psychiatric: He has a normal mood and affect. His behavior is normal. Thought content normal.  Vitals reviewed.         Assessment & Plan:  Radiating back pain- ongoing issue for pt although he is pain free today.  Suspect his back pain is positional and possibly due to his abdominal fat causing strain.  Will refer to PT for complete evaluation and development of home exercise plan.  Reviewed supportive care and red flags that should prompt return.  Pt expressed understanding and is in agreement w/ plan.

## 2016-04-11 DIAGNOSIS — H5203 Hypermetropia, bilateral: Secondary | ICD-10-CM | POA: Diagnosis not present

## 2016-04-11 DIAGNOSIS — H2513 Age-related nuclear cataract, bilateral: Secondary | ICD-10-CM | POA: Diagnosis not present

## 2016-04-11 DIAGNOSIS — H43812 Vitreous degeneration, left eye: Secondary | ICD-10-CM | POA: Diagnosis not present

## 2016-04-11 DIAGNOSIS — H52223 Regular astigmatism, bilateral: Secondary | ICD-10-CM | POA: Diagnosis not present

## 2016-04-11 DIAGNOSIS — H524 Presbyopia: Secondary | ICD-10-CM | POA: Diagnosis not present

## 2016-04-11 DIAGNOSIS — H401131 Primary open-angle glaucoma, bilateral, mild stage: Secondary | ICD-10-CM | POA: Diagnosis not present

## 2016-04-12 ENCOUNTER — Ambulatory Visit: Payer: Medicare Other | Attending: Family Medicine | Admitting: Physical Therapy

## 2016-04-12 DIAGNOSIS — M545 Low back pain, unspecified: Secondary | ICD-10-CM

## 2016-04-12 DIAGNOSIS — R293 Abnormal posture: Secondary | ICD-10-CM | POA: Insufficient documentation

## 2016-04-12 NOTE — Therapy (Signed)
[facility] [address]  [place], [place], [address] Phone: [phone]   Fax:  [phone]  Physical Therapy Evaluation  Patient Details  Name: Jeremiah Merritt MRN: [id number] Date of Birth: 06-14-1941 Referring Provider: Annye Asa, MD  Encounter Date: 04/12/2016      PT End of Session - 04/12/16 1533    Visit Number 1   Number of Visits --  to be determined   PT Start Time 1440   PT Stop Time 1523   PT Time Calculation (min) 43 min   Activity Tolerance Patient tolerated treatment well   Behavior During Therapy [facility] for tasks assessed/performed      Past Medical History:  Diagnosis Date  . COLONIC POLYPS, HX OF 02/27/2009  . ERECTILE DYSFUNCTION 12/08/2008  . HYPERLIPIDEMIA 12/08/2008  . HYPERTENSION 12/08/2008    Past Surgical History:  Procedure Laterality Date  . TONSILLECTOMY      There were no vitals filed for this visit.       Subjective Assessment - 04/12/16 1440    Subjective Pt is a 75 y/o male who presents to OPPT with 2 month hx of LBP which he reports now seems to be resolved.  Pt reports back aggravated by helping build a patio as well as picking up grandchildren about 2 weeks ago.  Pt reports he has been pain free for about 2 weeks now.     Pertinent History HTN, HLD   How long can you sit comfortably? unlimited   Diagnostic tests none   Patient Stated Goals unsure   Currently in Pain? No/denies   Pain Score 0-No pain   Pain Location Back   Pain Orientation Right   Pain Descriptors / Indicators Sharp   Pain Type Chronic pain   Pain Radiating Towards none   Pain Onset More than a month ago   Pain Frequency Intermittent   Aggravating Factors  movement   Pain Relieving Factors mobic   Effect of Pain on Daily Activities had trouble sleeping initially            [facility] PT Assessment - 04/12/16 1448      Assessment   Medical Diagnosis LBP   Referring Provider Annye Asa, MD   Onset Date/Surgical Date --  June 2017   Hand Dominance Right   Next MD Visit Dec 2017   Prior Therapy none     Precautions   Precautions None     Restrictions   Weight Bearing Restrictions No     Balance Screen   Has the patient fallen in the past 6 months No   Has the patient had a decrease in activity level because of a fear of falling?  No   Is the patient reluctant to leave their home because of a fear of falling?  No     Home Environment   Living Environment Private residence   Living Arrangements Spouse/significant other   Type of [place] to enter   Entrance Stairs-Number of Steps 2   [place] One level     Prior Function   Level of Independence Independent   Vocation Retired   Biomedical scientist retired from [employer]   Leisure spoil grandchildren, travel     Cognition   Overall Cognitive Status Within Functional Limits for tasks assessed     Observation/Other Assessments   Focus on Therapeutic Outcomes (FOTO)  67% (  33% limited; predicted 26% limited)     Posture/Postural Control   Posture/Postural Control Postural limitations   Postural Limitations Rounded Shoulders;Forward head;Left pelvic obliquity     AROM   AROM Assessment Site Lumbar   Lumbar Flexion 87   Lumbar Extension 30   Lumbar - Right Side Bend 25   Lumbar - Left Side Bend 25     Strength   Strength Assessment Site Hip;Knee;Ankle   Right/Left Hip Right;Left   Right Hip Flexion 5/5   Right Hip Extension 4/5   Right Hip ABduction 5/5   Right Hip ADduction 5/5   Left Hip Flexion 5/5   Left Hip Extension 4/5   Left Hip ABduction 5/5   Left Hip ADduction 5/5   Right/Left Knee Right;Left   Right Knee Flexion 5/5   Right Knee Extension 5/5   Left Knee Flexion 5/5   Left Knee Extension 5/5   Right Ankle Dorsiflexion 5/5   Left Ankle Dorsiflexion 5/5     Flexibility   Soft Tissue Assessment /Muscle Length yes    Hamstrings mild tightness bil     Palpation   Palpation comment no tenderness lumbar spine     Special Tests    Special Tests Lumbar   Lumbar Tests Straight Leg Raise     Straight Leg Raise   Findings Negative                   [facility] Adult PT Treatment/Exercise - 04/12/16 1448      Self-Care   Self-Care Other Self-Care Comments   Other Self-Care Comments  educated on posture/body mechanics                PT Education - 04/12/16 1533    Education provided Yes   Education Details posture/body International aid/development worker) Educated Patient   Methods Explanation;Handout   Comprehension Verbalized understanding             PT Long Term Goals - 04/12/16 1540      PT LONG TERM GOAL #1   Title to be determined if pt returns               Plan - 04/12/16 1535    Clinical Impression Statement Pt is a 75 y/o male referred to OPPT for LBP.  Pt without symptoms x 2 weeks and unable to reproduce symptoms in clinic today.  At this time no skilled PT indicated.  Chart to remain open x 30 days in case symptoms return.  If pt returns will plan to reassess and tx as indicated.   PT Frequency Other (comment)  to be determined if pt returns   PT Next Visit Plan hold x 30 days; d/c if no return; will need new g code   Consulted and Agree with Plan of Care Patient      Patient will benefit from skilled therapeutic intervention in order to improve the following deficits and impairments:  Postural dysfunction, Pain  Visit Diagnosis: Abnormal posture - Plan: PT plan of care cert/re-cert  Right-sided low back pain without sciatica - Plan: PT plan of care cert/re-cert      G-Codes - [id number]    Functional Assessment Tool Used clinical judgement   Functional Limitation Mobility: Walking and moving around   Mobility: Walking and Moving Around Current Status [id number]) 0 percent impaired, limited or restricted   Mobility: Walking and Moving Around Goal Status  [id number]) 0 percent impaired, limited or restricted   Mobility:  Walking and Moving Around Discharge Status [phone]) 0 percent impaired, limited or restricted       Problem List Patient Active Problem List   Diagnosis Date Noted  . Allergic rhinitis 12/30/2015  . Viral URI 12/21/2015  . Localized swelling of lower leg 10/07/2015  . Fatigue 12/17/2014  . Physical exam 06/17/2014  . Obesity (BMI 30-39.9) 06/17/2014  . COLONIC POLYPS, HX OF 02/27/2009  . Hyperlipidemia 12/08/2008  . ERECTILE DYSFUNCTION 12/08/2008  . Essential hypertension 12/08/2008       Laureen Abrahams, PT, DPT 04/12/16 3:44 PM    [facility] [address]  [place], [place], [address] Phone: [phone]   Fax:  [phone]  Name: Jeremiah Merritt MRN: [id number] Date of Birth: [birth year]

## 2016-04-12 NOTE — Patient Instructions (Signed)

## 2016-06-15 ENCOUNTER — Other Ambulatory Visit: Payer: Self-pay | Admitting: General Practice

## 2016-06-15 ENCOUNTER — Encounter: Payer: Self-pay | Admitting: Family Medicine

## 2016-06-15 MED ORDER — FUROSEMIDE 20 MG PO TABS: 20.0000 mg | ORAL_TABLET | Freq: Every day | ORAL | 1 refills | Status: DC

## 2016-06-15 MED ORDER — AMLODIPINE BESYLATE 10 MG PO TABS
10.0000 mg | ORAL_TABLET | Freq: Every day | ORAL | 1 refills | Status: DC
Start: 2016-06-15 — End: 2016-12-09

## 2016-06-15 MED ORDER — PRAVASTATIN SODIUM 40 MG PO TABS
40.0000 mg | ORAL_TABLET | Freq: Every day | ORAL | 1 refills | Status: DC
Start: 2016-06-15 — End: 2016-06-15

## 2016-06-15 MED ORDER — AMLODIPINE BESYLATE 10 MG PO TABS: 10.0000 mg | ORAL_TABLET | Freq: Every day | ORAL | 1 refills | Status: DC

## 2016-06-15 MED ORDER — PRAVASTATIN SODIUM 40 MG PO TABS: 40.0000 mg | ORAL_TABLET | Freq: Every day | ORAL | 1 refills | Status: DC

## 2016-06-23 ENCOUNTER — Other Ambulatory Visit: Payer: Self-pay

## 2016-06-24 ENCOUNTER — Encounter: Payer: Self-pay | Admitting: Family Medicine

## 2016-06-24 ENCOUNTER — Ambulatory Visit (INDEPENDENT_AMBULATORY_CARE_PROVIDER_SITE_OTHER): Payer: Medicare Other | Admitting: Family Medicine

## 2016-06-24 VITALS — BP 120/71 | HR 64 | Temp 98.1°F | Resp 16 | Ht 68.0 in | Wt 232.0 lb

## 2016-06-24 DIAGNOSIS — Z0000 Encounter for general adult medical examination without abnormal findings: Secondary | ICD-10-CM

## 2016-06-24 DIAGNOSIS — N401 Enlarged prostate with lower urinary tract symptoms: Secondary | ICD-10-CM | POA: Diagnosis not present

## 2016-06-24 DIAGNOSIS — Z1211 Encounter for screening for malignant neoplasm of colon: Secondary | ICD-10-CM | POA: Diagnosis not present

## 2016-06-24 DIAGNOSIS — Z23 Encounter for immunization: Secondary | ICD-10-CM

## 2016-06-24 DIAGNOSIS — I10 Essential (primary) hypertension: Secondary | ICD-10-CM

## 2016-06-24 DIAGNOSIS — N3943 Post-void dribbling: Secondary | ICD-10-CM | POA: Diagnosis not present

## 2016-06-24 DIAGNOSIS — E785 Hyperlipidemia, unspecified: Secondary | ICD-10-CM | POA: Diagnosis not present

## 2016-06-24 LAB — CBC WITH DIFFERENTIAL/PLATELET
BASOS ABS: 0 10*3/uL (ref 0.0–0.1)
Basophils Relative: 0.4 % (ref 0.0–3.0)
EOS ABS: 0.2 10*3/uL (ref 0.0–0.7)
Eosinophils Relative: 3.1 % (ref 0.0–5.0)
HEMATOCRIT: 50.5 % (ref 39.0–52.0)
Hemoglobin: 17.1 g/dL — ABNORMAL HIGH (ref 13.0–17.0)
LYMPHS PCT: 31.4 % (ref 12.0–46.0)
Lymphs Abs: 1.9 10*3/uL (ref 0.7–4.0)
MCHC: 33.9 g/dL (ref 30.0–36.0)
MCV: 90.8 fl (ref 78.0–100.0)
MONOS PCT: 5.8 % (ref 3.0–12.0)
Monocytes Absolute: 0.3 10*3/uL (ref 0.1–1.0)
NEUTROS PCT: 59.3 % (ref 43.0–77.0)
Neutro Abs: 3.5 10*3/uL (ref 1.4–7.7)
Platelets: 198 10*3/uL (ref 150.0–400.0)
RBC: 5.57 Mil/uL (ref 4.22–5.81)
RDW: 13.4 % (ref 11.5–15.5)
WBC: 5.9 10*3/uL (ref 4.0–10.5)

## 2016-06-24 LAB — LIPID PANEL
CHOLESTEROL: 173 mg/dL (ref 0–200)
HDL: 39.7 mg/dL (ref >39.00)
LDL CALC: 118 mg/dL — AB (ref 0–99)
NonHDL: 133.37
TRIGLYCERIDES: 76 mg/dL (ref 0.0–149.0)
Total CHOL/HDL Ratio: 4
VLDL: 15.2 mg/dL (ref 0.0–40.0)

## 2016-06-24 LAB — BASIC METABOLIC PANEL
BUN: 18 mg/dL (ref 6–23)
CALCIUM: 9.1 mg/dL (ref 8.4–10.5)
CO2: 35 mEq/L — ABNORMAL HIGH (ref 19–32)
CREATININE: 0.94 mg/dL (ref 0.40–1.50)
Chloride: 102 mEq/L (ref 96–112)
GFR: 82.69 mL/min (ref >60.00)
GLUCOSE: 104 mg/dL — AB (ref 70–99)
Potassium: 4.6 mEq/L (ref 3.5–5.1)
Sodium: 141 mEq/L (ref 135–145)

## 2016-06-24 LAB — HEPATIC FUNCTION PANEL
ALBUMIN: 4.4 g/dL (ref 3.5–5.2)
ALT: 15 U/L (ref 0–53)
AST: 15 U/L (ref 0–37)
Alkaline Phosphatase: 59 U/L (ref 39–117)
Bilirubin, Direct: 0.1 mg/dL (ref 0.0–0.3)
TOTAL PROTEIN: 6.7 g/dL (ref 6.0–8.3)
Total Bilirubin: 0.7 mg/dL (ref 0.2–1.2)

## 2016-06-24 LAB — PSA: PSA: 1.62 ng/mL (ref 0.10–4.00)

## 2016-06-24 LAB — TSH: TSH: 1.78 u[IU]/mL (ref 0.35–4.50)

## 2016-06-24 NOTE — Patient Instructions (Signed)
Follow up in 6 months to recheck BP and cholesterol We'll notify you of your lab results and make any changes if needed Continue to work on healthy diet and regular exercise- you can do it! We'll call you with your GI and urology appt You are now up to date on immunizations- yay! Call with any questions or concerns Happy Holidays!!!

## 2016-06-24 NOTE — Progress Notes (Signed)
Pre visit review using our clinic review tool, if applicable. No additional management support is needed unless otherwise documented below in the visit note. 

## 2016-06-24 NOTE — Progress Notes (Signed)
   Subjective:    Patient ID: Jeremiah Merritt, male    DOB: [birth year], 75 y.o.   MRN: [id number]  HPI Here today for CPE.  Risk Factors: HTN- chronic problem, on Amlodipine, Lasix w/ good BP control.  Denies CP, SOB, HAs, visual changes, edema Hyperlipidemia- chronic problem, on Pravastatin.  Denies abd pain, N/V, myalgias Physical Activity: limited exercise- plans to start going to gym daily Fall Risk: Low Depression: denies Hearing: normal to conversational tones ADL's: independent Cognitive: normal linear thought process, memory and attention intact Home Safety: safe at home, lives w/ wife Height, Weight, BMI, Visual Acuity: see vitals, vision corrected to 20/20 w/ glasses Counseling: UTD on flu shot, due for repeat Pneumovax.  Due for recall colonoscopy w/ [facility] Care team reviewed and updated w/ pt Labs Ordered: See A&P Care Plan: See A&P    Review of Systems Patient reports no vision/hearing changes, anorexia, fever ,adenopathy, persistant/recurrent hoarseness, swallowing issues, chest pain, palpitations, edema, persistant/recurrent cough, hemoptysis, dyspnea (rest,exertional, paroxysmal nocturnal), gastrointestinal  bleeding (melena, rectal bleeding), abdominal pain, excessive heart burn, syncope, focal weakness, memory loss, numbness & tingling, skin/hair/nail changes, depression, anxiety, abnormal bruising/bleeding, musculoskeletal symptoms/signs.   + dribbling after urination, 'i'm never done'    Objective:   Physical Exam General Appearance:    Alert, cooperative, no distress, appears stated age  Head:    Normocephalic, without obvious abnormality, atraumatic  Eyes:    PERRL, conjunctiva/corneas clear, EOM's intact, fundi    benign, both eyes       Ears:    Normal TM's and external ear canals, both ears  Nose:   Nares normal, septum midline, mucosa normal, no drainage   or sinus tenderness  Throat:   Lips, mucosa, and tongue normal; teeth and gums normal  Neck:    Supple, symmetrical, trachea midline, no adenopathy;       thyroid:  No enlargement/tenderness/nodules  Back:     Symmetric, no curvature, ROM normal, no CVA tenderness  Lungs:     Clear to auscultation bilaterally, respirations unlabored  Chest wall:    No tenderness or deformity  Heart:    Regular rate and rhythm, S1 and S2 normal, no murmur, rub   or gallop  Abdomen:     Soft, non-tender, bowel sounds active all four quadrants,    no masses, no organomegaly  Genitalia:    Deferred to urology  Rectal:    Extremities:   Extremities normal, atraumatic, no cyanosis or edema  Pulses:   2+ and symmetric all extremities  Skin:   Skin color, texture, turgor normal, no rashes or lesions  Lymph nodes:   Cervical, supraclavicular, and axillary nodes normal  Neurologic:   CNII-XII intact. Normal strength, sensation and reflexes      throughout          Assessment & Plan:

## 2016-07-01 NOTE — Assessment & Plan Note (Signed)
Pt's PE WNL and unchanged from previous.  UTD on flu shot, due for Pneumovax- given today.  Due for repeat colonoscopy- referral back to GI.  Written screening schedule updated and given to pt.  Check labs.  Anticipatory guidance provided.

## 2016-07-01 NOTE — Assessment & Plan Note (Signed)
Chronic problem, tolerating statin w/o difficulty.  Stressed need for healthy diet and regular exercise.  Check labs.  Adjust meds prn  

## 2016-07-01 NOTE — Assessment & Plan Note (Signed)
Chronic problem. Well controlled on current medications.  Asymptomatic.  Check labs.  No anticipated med changes at this time.

## 2016-07-07 DIAGNOSIS — Z86010 Personal history of colonic polyps: Secondary | ICD-10-CM | POA: Diagnosis not present

## 2016-08-16 DIAGNOSIS — N5201 Erectile dysfunction due to arterial insufficiency: Secondary | ICD-10-CM | POA: Diagnosis not present

## 2016-08-16 DIAGNOSIS — N3943 Post-void dribbling: Secondary | ICD-10-CM | POA: Diagnosis not present

## 2016-08-16 DIAGNOSIS — N400 Enlarged prostate without lower urinary tract symptoms: Secondary | ICD-10-CM | POA: Diagnosis not present

## 2016-08-17 DIAGNOSIS — Z86010 Personal history of colonic polyps: Secondary | ICD-10-CM | POA: Diagnosis not present

## 2016-08-17 DIAGNOSIS — D126 Benign neoplasm of colon, unspecified: Secondary | ICD-10-CM | POA: Diagnosis not present

## 2016-08-17 DIAGNOSIS — K5730 Diverticulosis of large intestine without perforation or abscess without bleeding: Secondary | ICD-10-CM | POA: Diagnosis not present

## 2016-08-17 DIAGNOSIS — K635 Polyp of colon: Secondary | ICD-10-CM | POA: Diagnosis not present

## 2016-10-10 DIAGNOSIS — H401131 Primary open-angle glaucoma, bilateral, mild stage: Secondary | ICD-10-CM | POA: Diagnosis not present

## 2016-10-10 DIAGNOSIS — H2513 Age-related nuclear cataract, bilateral: Secondary | ICD-10-CM | POA: Diagnosis not present

## 2016-10-10 DIAGNOSIS — H43812 Vitreous degeneration, left eye: Secondary | ICD-10-CM | POA: Diagnosis not present

## 2016-10-10 DIAGNOSIS — H5203 Hypermetropia, bilateral: Secondary | ICD-10-CM | POA: Diagnosis not present

## 2016-10-10 DIAGNOSIS — H52223 Regular astigmatism, bilateral: Secondary | ICD-10-CM | POA: Diagnosis not present

## 2016-10-10 DIAGNOSIS — H524 Presbyopia: Secondary | ICD-10-CM | POA: Diagnosis not present

## 2016-12-09 ENCOUNTER — Other Ambulatory Visit: Payer: Self-pay | Admitting: Family Medicine

## 2016-12-23 ENCOUNTER — Encounter: Payer: Self-pay | Admitting: Family Medicine

## 2016-12-23 ENCOUNTER — Ambulatory Visit (INDEPENDENT_AMBULATORY_CARE_PROVIDER_SITE_OTHER): Payer: Medicare Other | Admitting: Family Medicine

## 2016-12-23 VITALS — BP 123/83 | HR 76 | Temp 98.0°F | Resp 17 | Ht 68.0 in | Wt 230.2 lb

## 2016-12-23 DIAGNOSIS — E785 Hyperlipidemia, unspecified: Secondary | ICD-10-CM | POA: Diagnosis not present

## 2016-12-23 DIAGNOSIS — I10 Essential (primary) hypertension: Secondary | ICD-10-CM

## 2016-12-23 LAB — CBC WITH DIFFERENTIAL/PLATELET
BASOS ABS: 0 10*3/uL (ref 0.0–0.1)
BASOS PCT: 0.4 % (ref 0.0–3.0)
EOS ABS: 0.2 10*3/uL (ref 0.0–0.7)
Eosinophils Relative: 3.1 % (ref 0.0–5.0)
HCT: 51 % (ref 39.0–52.0)
Hemoglobin: 17.3 g/dL — ABNORMAL HIGH (ref 13.0–17.0)
LYMPHS ABS: 1.9 10*3/uL (ref 0.7–4.0)
Lymphocytes Relative: 34.5 % (ref 12.0–46.0)
MCHC: 34 g/dL (ref 30.0–36.0)
MCV: 91.4 fl (ref 78.0–100.0)
MONOS PCT: 6.3 % (ref 3.0–12.0)
Monocytes Absolute: 0.3 10*3/uL (ref 0.1–1.0)
NEUTROS ABS: 3.1 10*3/uL (ref 1.4–7.7)
NEUTROS PCT: 55.7 % (ref 43.0–77.0)
Platelets: 197 10*3/uL (ref 150.0–400.0)
RBC: 5.59 Mil/uL (ref 4.22–5.81)
RDW: 12.6 % (ref 11.5–15.5)
WBC: 5.5 10*3/uL (ref 4.0–10.5)

## 2016-12-23 LAB — BASIC METABOLIC PANEL
BUN: 19 mg/dL (ref 6–23)
CHLORIDE: 103 meq/L (ref 96–112)
CO2: 34 meq/L — AB (ref 19–32)
Calcium: 9.3 mg/dL (ref 8.4–10.5)
Creatinine, Ser: 0.97 mg/dL (ref 0.40–1.50)
GFR: 79.64 mL/min (ref >60.00)
Glucose, Bld: 106 mg/dL — ABNORMAL HIGH (ref 70–99)
Potassium: 4.7 mEq/L (ref 3.5–5.1)
SODIUM: 142 meq/L (ref 135–145)

## 2016-12-23 LAB — LIPID PANEL
Cholesterol: 154 mg/dL (ref 0–200)
HDL: 34.1 mg/dL — ABNORMAL LOW (ref >39.00)
LDL Cholesterol: 104 mg/dL — ABNORMAL HIGH (ref 0–99)
NONHDL: 120.34
Total CHOL/HDL Ratio: 5
Triglycerides: 84 mg/dL (ref 0.0–149.0)
VLDL: 16.8 mg/dL (ref 0.0–40.0)

## 2016-12-23 LAB — HEPATIC FUNCTION PANEL
ALT: 14 U/L (ref 0–53)
AST: 11 U/L (ref 0–37)
Albumin: 4.4 g/dL (ref 3.5–5.2)
Alkaline Phosphatase: 54 U/L (ref 39–117)
BILIRUBIN DIRECT: 0.2 mg/dL (ref 0.0–0.3)
BILIRUBIN TOTAL: 0.8 mg/dL (ref 0.2–1.2)
TOTAL PROTEIN: 6.5 g/dL (ref 6.0–8.3)

## 2016-12-23 NOTE — Progress Notes (Signed)
Pre visit review using our clinic review tool, if applicable. No additional management support is needed unless otherwise documented below in the visit note. 

## 2016-12-23 NOTE — Assessment & Plan Note (Signed)
Chronic problem.  Tolerating statin w/o difficulty.  Stressed need for healthy diet and regular exercise.  Check labs.  Adjust meds prn.  Will follow.

## 2016-12-23 NOTE — Progress Notes (Signed)
   Subjective:    Patient ID: Jeremiah Merritt, male    DOB: 1940/08/21, 76 y.o.   MRN: [id number]  HPI HTN- chronic problem, on Amlodipine and Lasix daily w/ good control.  Denies CP, SOB, HAs, visual changes, edema.  Hyperlipidemia- chronic problem, on Pravastatin daily.  No abd pain, N/V, myalgias.  Not exercising regularly.   Review of Systems For ROS see HPI     Objective:   Physical Exam  Constitutional: He is oriented to person, place, and time. He appears well-developed and well-nourished. No distress.  obese  HENT:  Head: Normocephalic and atraumatic.  Eyes: Conjunctivae and EOM are normal. Pupils are equal, round, and reactive to light.  Neck: Normal range of motion. Neck supple. No thyromegaly present.  Cardiovascular: Normal rate, regular rhythm, normal heart sounds and intact distal pulses.   No murmur heard. Pulmonary/Chest: Effort normal and breath sounds normal. No respiratory distress.  Abdominal: Soft. Bowel sounds are normal. He exhibits no distension.  Musculoskeletal: He exhibits no edema.  Lymphadenopathy:    He has no cervical adenopathy.  Neurological: He is alert and oriented to person, place, and time. No cranial nerve deficit.  Skin: Skin is warm and dry.  Psychiatric: He has a normal mood and affect. His behavior is normal.  Vitals reviewed.         Assessment & Plan:

## 2016-12-23 NOTE — Patient Instructions (Signed)
Schedule your Medicare Wellness Visit w/ Maudie Mercury in 6 months and a follow up w/ me at the same time [facility] notify you of your lab results and make any changes if needed Continue to work on healthy diet and regular exercise- you can do it! Call with any questions or concerns Have a great summer!!!

## 2016-12-23 NOTE — Assessment & Plan Note (Signed)
Chronic problem.  Well controlled today.  Stressed need for healthy diet and regular exercise.  Check labs.  No anticipated med changes. 

## 2017-01-26 ENCOUNTER — Ambulatory Visit (INDEPENDENT_AMBULATORY_CARE_PROVIDER_SITE_OTHER): Payer: Medicare Other | Admitting: Family Medicine

## 2017-01-26 ENCOUNTER — Encounter: Payer: Self-pay | Admitting: Family Medicine

## 2017-01-26 DIAGNOSIS — M25562 Pain in left knee: Secondary | ICD-10-CM | POA: Diagnosis not present

## 2017-01-26 NOTE — Patient Instructions (Signed)
Your pain is due to flare of arthritis. These are the different medications you can take for this: Tylenol 500mg  1-2 tabs three times a day for pain. Aleve 1-2 tabs twice a day with food - would take this for 7-10 days then as needed along with the tylenol. Capsaicin, aspercreme, or biofreeze topically up to four times a day may also help with pain. Some supplements that may help for arthritis: Boswellia extract, curcumin, pycnogenol Cortisone injections are an option. If cortisone injections do not help, there are different types of shots that may help but they take longer to take effect. It's important that you continue to stay active. Straight leg raises, knee extensions 3 sets of 10 once a day (add ankle weight if these become too easy). Consider physical therapy to strengthen muscles around the joint that hurts to take pressure off of the joint itself. Shoe inserts with good arch support may be helpful. Heat or ice 15 minutes at a time 3-4 times a day as needed to help with pain. Water aerobics and cycling with low resistance are the best two types of exercise for arthritis. Follow up with me in 1 month (or as needed) but call me sooner if you're struggling.

## 2017-01-26 NOTE — Assessment & Plan Note (Signed)
consistent with flare of arthritis.  Discussed tylenol, aleve, topical medications, supplements that may help.  Shown home exercises to do daily.  Consider injection, physical therapy if not improving.  F/u in 1 month (or as needed if doing well).

## 2017-01-26 NOTE — Progress Notes (Signed)
PCP: Midge Minium, MD  Subjective:   HPI: Patient is a 76 y.o. male here for left knee pain.  Patient reports he's had about 3 weeks of left knee pain. Started hurting anteriorly when he was playing cornhole. Pain is 0/10 at rest but up to 10/10 and sharp at times with walking. Wears a compression hose. Not tried any regular icing or medicines except tylenol at bedtime. No skin changes, numbness.  Past Medical History:  Diagnosis Date  . COLONIC POLYPS, HX OF 02/27/2009  . ERECTILE DYSFUNCTION 12/08/2008  . HYPERLIPIDEMIA 12/08/2008  . HYPERTENSION 12/08/2008    Current Outpatient Prescriptions on File Prior to Visit  Medication Sig Dispense Refill  . acetaminophen (TYLENOL) 500 MG tablet Take 1,000 mg by mouth every morning.    Marland Kitchen amLODipine (NORVASC) 10 MG tablet TAKE 1 TABLET (10 MG TOTAL) BY MOUTH DAILY. 90 tablet 0  . furosemide (LASIX) 20 MG tablet TAKE 1 TABLET (20 MG TOTAL) BY MOUTH DAILY. 90 tablet 0  . latanoprost (XALATAN) 0.005 % ophthalmic solution Place 1 drop into both eyes at bedtime.     . pravastatin (PRAVACHOL) 40 MG tablet TAKE 1 TABLET (40 MG TOTAL) BY MOUTH DAILY. 90 tablet 0   No current facility-administered medications on file prior to visit.     Past Surgical History:  Procedure Laterality Date  . TONSILLECTOMY      Allergies  Allergen Reactions  . Penicillins     REACTION: Hives    Social History   Social History  . Marital status: Married    Spouse name: N/A  . Number of children: N/A  . Years of education: N/A   Occupational History  . Not on file.   Social History Main Topics  . Smoking status: Never Smoker  . Smokeless tobacco: Never Used  . Alcohol use Not on file  . Drug use: Unknown  . Sexual activity: Not on file   Other Topics Concern  . Not on file   Social History Narrative  . No narrative on file    Family History  Problem Relation Age of Onset  . Cancer Other        prostate    BP (!) 152/82   Pulse  69   Ht 5\' 8"  (1.727 m)   Wt 230 lb (104.3 kg)   BMI 34.97 kg/m   Review of Systems: See HPI above.     Objective:  Physical Exam:  Gen: NAD, comfortable in exam room  Left knee: No gross deformity, ecchymoses, effusion. TTP medial joint line, post patellar facets.  No other tenderness. FROM. Negative ant/post drawers. Negative valgus/varus testing. Negative lachmanns. Negative mcmurrays, apleys, patellar apprehension. NV intact distally.  Right knee: FROM without pain.   Assessment & Plan:  1. Left knee pain - consistent with flare of arthritis.  Discussed tylenol, aleve, topical medications, supplements that may help.  Shown home exercises to do daily.  Consider injection, physical therapy if not improving.  F/u in 1 month (or as needed if doing well).

## 2017-03-06 ENCOUNTER — Ambulatory Visit (INDEPENDENT_AMBULATORY_CARE_PROVIDER_SITE_OTHER): Payer: Medicare Other | Admitting: Family Medicine

## 2017-03-06 ENCOUNTER — Ambulatory Visit (HOSPITAL_BASED_OUTPATIENT_CLINIC_OR_DEPARTMENT_OTHER)
Admission: RE | Admit: 2017-03-06 | Discharge: 2017-03-06 | Disposition: A | Discharge status: Home / Self Care | Payer: Medicare Other | Source: Ambulatory Visit | Attending: Family Medicine | Admitting: Family Medicine

## 2017-03-06 ENCOUNTER — Encounter: Payer: Self-pay | Admitting: Family Medicine

## 2017-03-06 VITALS — BP 130/76 | HR 80 | Ht 68.0 in | Wt 231.4 lb

## 2017-03-06 DIAGNOSIS — M79605 Pain in left leg: Secondary | ICD-10-CM | POA: Diagnosis not present

## 2017-03-06 DIAGNOSIS — M25562 Pain in left knee: Secondary | ICD-10-CM | POA: Diagnosis not present

## 2017-03-06 NOTE — Assessment & Plan Note (Signed)
consistent with flare of arthritis that unfortunately has not responded to some conservative measures.  Has not been taking anything for this though.  Having trouble with exercises due to pain.  Getting increased pain posteriorly and with history of DVT repeated doppler u/s - this was negative.  Consider injection, physical therapy.

## 2017-03-06 NOTE — Patient Instructions (Signed)
Get the ultrasound later this afternoon downstairs - I've put the order in. Assuming this is normal we will talk about whether to do physical therapy and/or injection of the knee.  These are the other arthritis instructions, similar to last visit. These are the different medications you can take for this: Tylenol 500mg  1-2 tabs three times a day for pain. Capsaicin, aspercreme, or biofreeze topically up to four times a day may also help with pain. Some supplements that may help for arthritis: Boswellia extract, curcumin, pycnogenol Aleve 1-2 tabs twice a day with food Cortisone injections are an option. If cortisone injections do not help, there are different types of shots that may help but they take longer to take effect. It's important that you continue to stay active. Straight leg raises, knee extensions 3 sets of 10 once a day (add ankle weight if these become too easy). Consider physical therapy to strengthen muscles around the joint that hurts to take pressure off of the joint itself. Shoe inserts with good arch support may be helpful. Heat or ice 15 minutes at a time 3-4 times a day as needed to help with pain. Water aerobics and cycling with low resistance are the best two types of exercise for arthritis though any exercise is ok as long as it doesn't worsen the pain.

## 2017-03-06 NOTE — Progress Notes (Signed)
PCP: Midge Minium, MD  Subjective:   HPI: Patient is a 76 y.o. male here for left knee pain.  7/12: Patient reports he's had about 3 weeks of left knee pain. Started hurting anteriorly when he was playing cornhole. Pain is 0/10 at rest but up to 10/10 and sharp at times with walking. Wears a compression hose. Not tried any regular icing or medicines except tylenol at bedtime. No skin changes, numbness.  8/20: Patient reports he feels about the same overall. This weekend was bad though and pain worse up to 6/10 and sharp on Saturday. Feels worse if on feet a long time, going from sitting to standing. Seems worse when doing his exercises. Pain anterior but getting some pain into his left calf and posterior knee now too. Has history of DVT about 20 years ago. Pain currently 0/10 at rest. No skin changes, numbness.  Past Medical History:  Diagnosis Date  . COLONIC POLYPS, HX OF 02/27/2009  . ERECTILE DYSFUNCTION 12/08/2008  . HYPERLIPIDEMIA 12/08/2008  . HYPERTENSION 12/08/2008    Current Outpatient Prescriptions on File Prior to Visit  Medication Sig Dispense Refill  . acetaminophen (TYLENOL) 500 MG tablet Take 1,000 mg by mouth every morning.    Marland Kitchen amLODipine (NORVASC) 10 MG tablet TAKE 1 TABLET (10 MG TOTAL) BY MOUTH DAILY. 90 tablet 0  . furosemide (LASIX) 20 MG tablet TAKE 1 TABLET (20 MG TOTAL) BY MOUTH DAILY. 90 tablet 0  . latanoprost (XALATAN) 0.005 % ophthalmic solution Place 1 drop into both eyes at bedtime.     . pravastatin (PRAVACHOL) 40 MG tablet TAKE 1 TABLET (40 MG TOTAL) BY MOUTH DAILY. 90 tablet 0   No current facility-administered medications on file prior to visit.     Past Surgical History:  Procedure Laterality Date  . TONSILLECTOMY      Allergies  Allergen Reactions  . Penicillins     REACTION: Hives    Social History   Social History  . Marital status: Married    Spouse name: N/A  . Number of children: N/A  . Years of education: N/A    Occupational History  . Not on file.   Social History Main Topics  . Smoking status: Never Smoker  . Smokeless tobacco: Never Used  . Alcohol use Not on file  . Drug use: Unknown  . Sexual activity: Not on file   Other Topics Concern  . Not on file   Social History Narrative  . No narrative on file    Family History  Problem Relation Age of Onset  . Cancer Other        prostate    BP 130/76   Pulse 80   Ht 5\' 8"  (1.727 m)   Wt 231 lb 6.4 oz (105 kg)   BMI 35.18 kg/m   Review of Systems: See HPI above.     Objective:  Physical Exam:  Gen: NAD, comfortable in exam room  Left knee: No gross deformity, ecchymoses, effusion. No TTP currently. FROM. Negative ant/post drawers. Negative valgus/varus testing. Negative lachmanns. Negative mcmurrays, apleys, patellar apprehension. NV intact distally.  Right knee: FROM without pain.   Assessment & Plan:  1. Left knee pain - consistent with flare of arthritis that unfortunately has not responded to some conservative measures.  Has not been taking anything for this though.  Having trouble with exercises due to pain.  Getting increased pain posteriorly and with history of DVT repeated doppler u/s - this was negative.  Consider injection, physical therapy.

## 2017-03-08 ENCOUNTER — Ambulatory Visit (INDEPENDENT_AMBULATORY_CARE_PROVIDER_SITE_OTHER): Payer: Medicare Other | Admitting: Family Medicine

## 2017-03-08 ENCOUNTER — Other Ambulatory Visit: Payer: Self-pay | Admitting: Family Medicine

## 2017-03-08 ENCOUNTER — Encounter: Payer: Self-pay | Admitting: Family Medicine

## 2017-03-08 VITALS — BP 134/87 | HR 65 | Ht 68.0 in | Wt 232.0 lb

## 2017-03-08 DIAGNOSIS — M25562 Pain in left knee: Secondary | ICD-10-CM | POA: Diagnosis present

## 2017-03-08 MED ORDER — METHYLPREDNISOLONE ACETATE 40 MG/ML IJ SUSP
40.0000 mg | Freq: Once | INTRAMUSCULAR | Status: AC
  Administered 2017-03-08: 40 mg via INTRA_ARTICULAR

## 2017-03-09 NOTE — Progress Notes (Signed)
PCP: Midge Minium, MD  Subjective:   HPI: Patient is a 76 y.o. male here for left knee pain.  7/12: Patient reports he's had about 3 weeks of left knee pain. Started hurting anteriorly when he was playing cornhole. Pain is 0/10 at rest but up to 10/10 and sharp at times with walking. Wears a compression hose. Not tried any regular icing or medicines except tylenol at bedtime. No skin changes, numbness.  8/20: Patient reports he feels about the same overall. This weekend was bad though and pain worse up to 6/10 and sharp on Saturday. Feels worse if on feet a long time, going from sitting to standing. Seems worse when doing his exercises. Pain anterior but getting some pain into his left calf and posterior knee now too. Has history of DVT about 20 years ago. Pain currently 0/10 at rest. No skin changes, numbness.  8/22: Patient returns for cortisone injection. Ultrasound was negative for DVT.  Past Medical History:  Diagnosis Date  . COLONIC POLYPS, HX OF 02/27/2009  . ERECTILE DYSFUNCTION 12/08/2008  . HYPERLIPIDEMIA 12/08/2008  . HYPERTENSION 12/08/2008    Current Outpatient Prescriptions on File Prior to Visit  Medication Sig Dispense Refill  . acetaminophen (TYLENOL) 500 MG tablet Take 1,000 mg by mouth every morning.    . latanoprost (XALATAN) 0.005 % ophthalmic solution Place 1 drop into both eyes at bedtime.      No current facility-administered medications on file prior to visit.     Past Surgical History:  Procedure Laterality Date  . TONSILLECTOMY      Allergies  Allergen Reactions  . Penicillins     REACTION: Hives    Social History   Social History  . Marital status: Married    Spouse name: N/A  . Number of children: N/A  . Years of education: N/A   Occupational History  . Not on file.   Social History Main Topics  . Smoking status: Never Smoker  . Smokeless tobacco: Never Used  . Alcohol use Not on file  . Drug use: Unknown  .  Sexual activity: Not on file   Other Topics Concern  . Not on file   Social History Narrative  . No narrative on file    Family History  Problem Relation Age of Onset  . Cancer Other        prostate    BP 134/87   Pulse 65   Ht 5\' 8"  (1.727 m)   Wt 232 lb (105.2 kg)   BMI 35.28 kg/m   Review of Systems: See HPI above.     Objective:  Physical Exam:  Gen: NAD, comfortable in exam room  Left knee: No gross deformity, ecchymoses, effusion. No TTP currently. FROM. Negative ant/post drawers. Negative valgus/varus testing. Negative lachmanns. Negative mcmurrays, apleys, patellar apprehension. NV intact distally.  Right knee: FROM without pain.   Assessment & Plan:  1. Left knee pain - consistent with flare of arthritis that unfortunately has not responded to some conservative measures.  Has not been taking anything for this though.  Having trouble with exercises due to pain.  Getting increased pain posteriorly and with history of DVT repeated doppler u/s - this was negative.  Consider physical therapy.  Injection given today.  After informed written consent timeout was performed, patient was seated on exam table. Left knee was prepped with alcohol swab and utilizing anteromedial approach, patient's left knee was injected intraarticularly with 3:1 bupivicaine: depomedrol. Patient tolerated the procedure well  without immediate complications.

## 2017-03-09 NOTE — Assessment & Plan Note (Signed)
consistent with flare of arthritis that unfortunately has not responded to some conservative measures.  Has not been taking anything for this though.  Having trouble with exercises due to pain.  Getting increased pain posteriorly and with history of DVT repeated doppler u/s - this was negative.  Consider physical therapy.  Injection given today.  After informed written consent timeout was performed, patient was seated on exam table. Left knee was prepped with alcohol swab and utilizing anteromedial approach, patient's left knee was injected intraarticularly with 3:1 bupivicaine: depomedrol. Patient tolerated the procedure well without immediate complications.

## 2017-04-17 DIAGNOSIS — H52223 Regular astigmatism, bilateral: Secondary | ICD-10-CM | POA: Diagnosis not present

## 2017-04-17 DIAGNOSIS — H5203 Hypermetropia, bilateral: Secondary | ICD-10-CM | POA: Diagnosis not present

## 2017-04-17 DIAGNOSIS — H43812 Vitreous degeneration, left eye: Secondary | ICD-10-CM | POA: Diagnosis not present

## 2017-04-17 DIAGNOSIS — H524 Presbyopia: Secondary | ICD-10-CM | POA: Diagnosis not present

## 2017-04-17 DIAGNOSIS — H401131 Primary open-angle glaucoma, bilateral, mild stage: Secondary | ICD-10-CM | POA: Diagnosis not present

## 2017-04-17 DIAGNOSIS — H2513 Age-related nuclear cataract, bilateral: Secondary | ICD-10-CM | POA: Diagnosis not present

## 2017-05-22 ENCOUNTER — Ambulatory Visit (INDEPENDENT_AMBULATORY_CARE_PROVIDER_SITE_OTHER): Payer: Medicare Other

## 2017-05-22 DIAGNOSIS — Z23 Encounter for immunization: Secondary | ICD-10-CM | POA: Diagnosis not present

## 2017-06-12 ENCOUNTER — Other Ambulatory Visit: Payer: Self-pay | Admitting: Family Medicine

## 2017-07-06 ENCOUNTER — Ambulatory Visit: Payer: Medicare Other | Admitting: Family Medicine

## 2017-07-27 ENCOUNTER — Ambulatory Visit: Payer: Medicare Other | Admitting: Family Medicine

## 2017-08-24 ENCOUNTER — Other Ambulatory Visit: Payer: Self-pay | Admitting: General Practice

## 2017-08-24 MED ORDER — OSELTAMIVIR PHOSPHATE 75 MG PO CAPS: 75.0000 mg | ORAL_CAPSULE | Freq: Every day | ORAL | 0 refills | Status: DC

## 2017-09-07 ENCOUNTER — Other Ambulatory Visit: Payer: Self-pay | Admitting: Family Medicine

## 2017-09-09 IMAGING — US US EXTREM LOW VENOUS*L*
1 series · 13 of 24 positions shown · non-contrast
Comparison: None.

CLINICAL DATA: Bilateral lower leg swelling. 10 hour car ride on
[REDACTED]. Previous history of DVT left leg 10 years ago.



[Series 1: us extrem low venous*left* · 0.09mm/px · 13 of 28 slices shown]
[im 1/28]
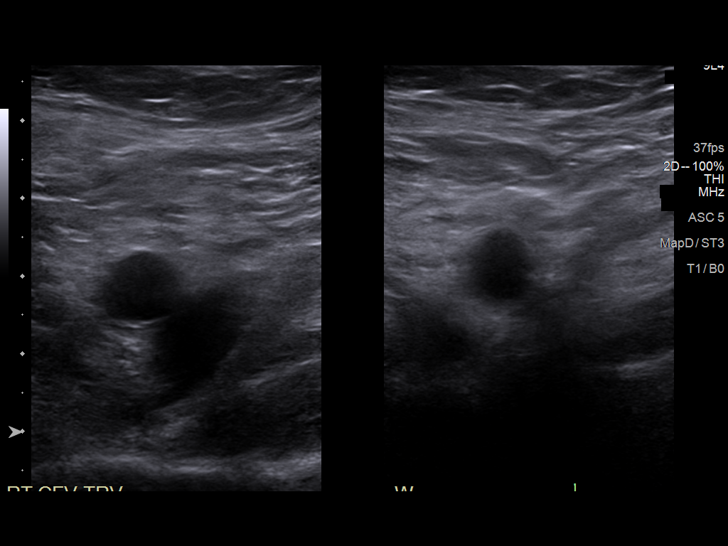
[im 3/28]
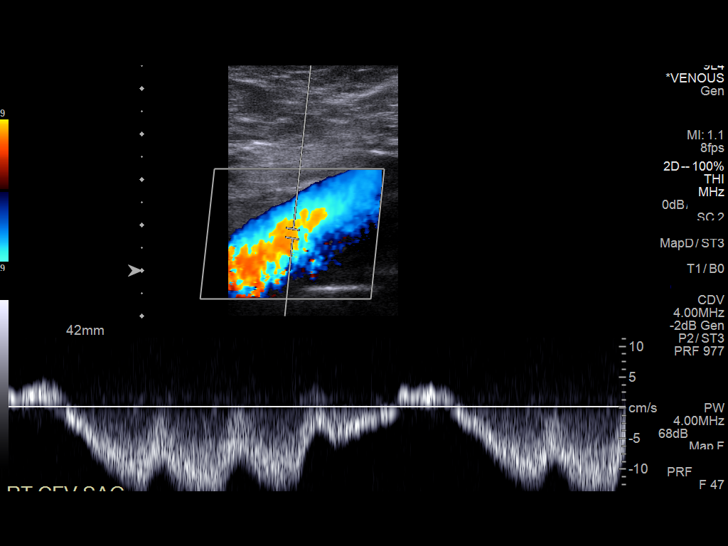
[im 5/28]
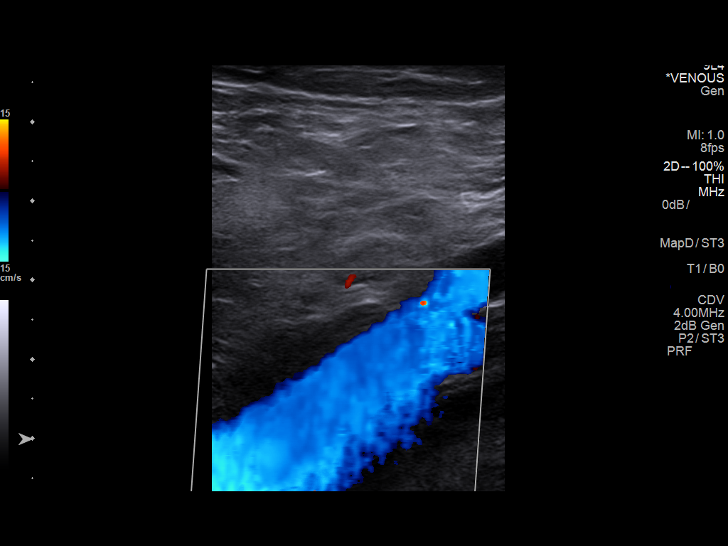
[im 8/28]
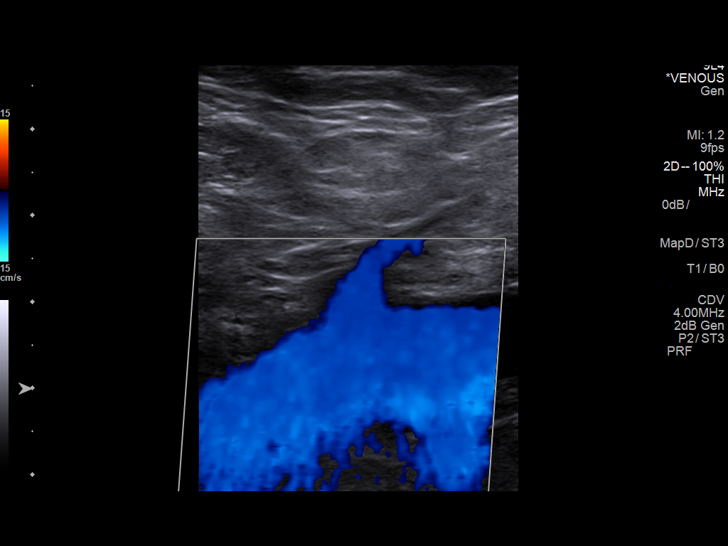
[im 10/28]
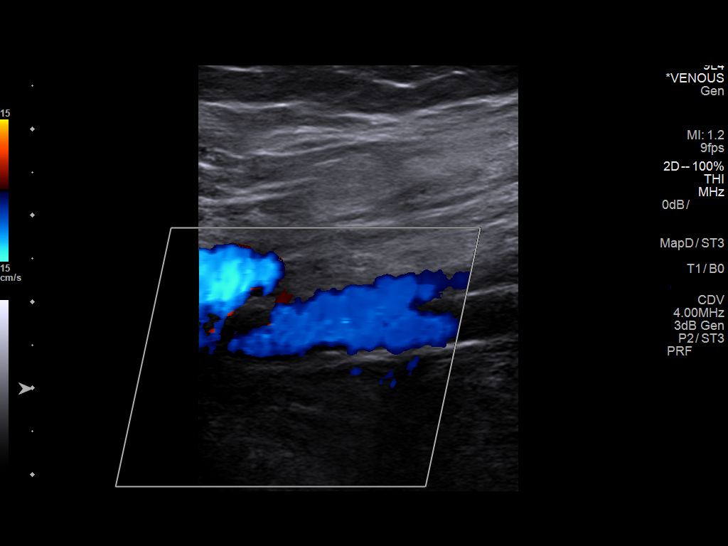
[im 12/28]
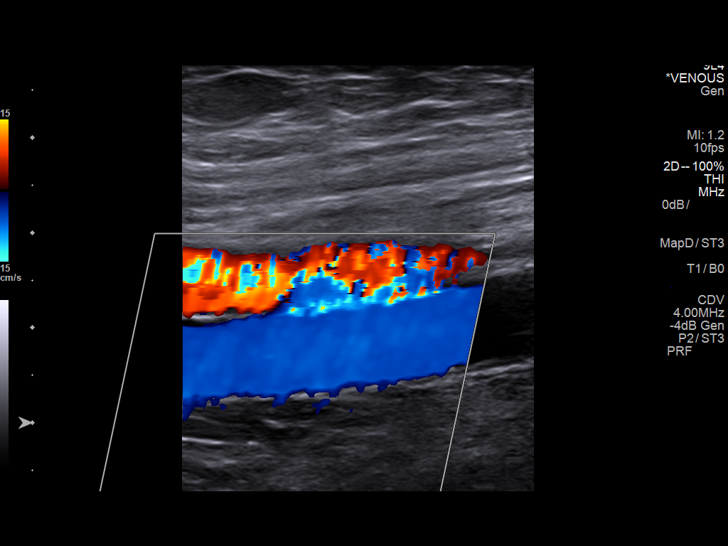
[im 15/28]
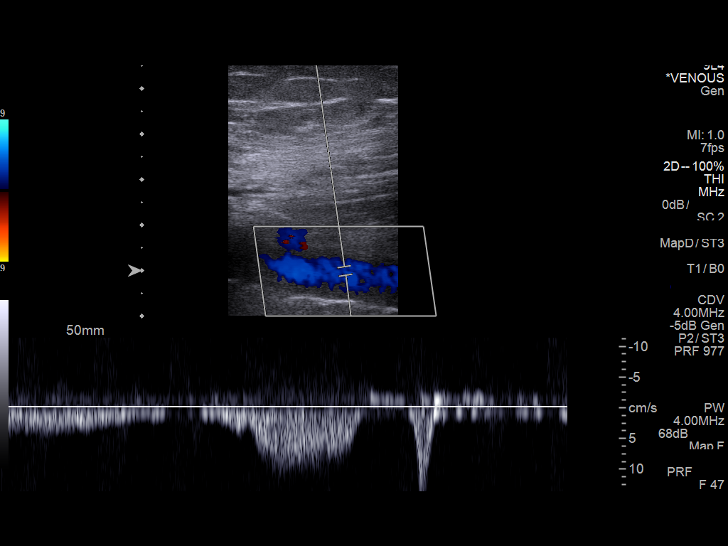
[im 16/28]
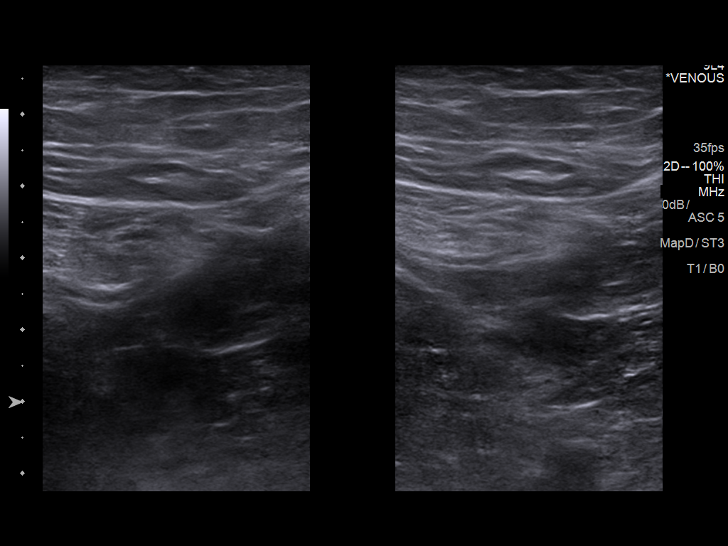
[im 18/28]
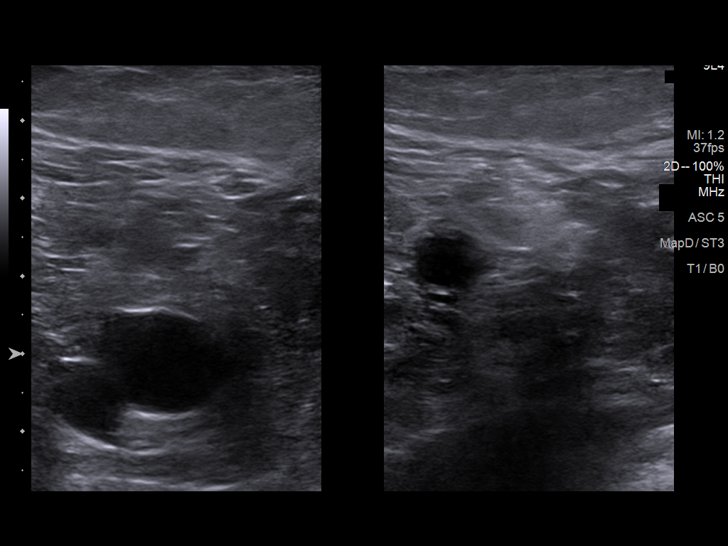
[im 20/28]
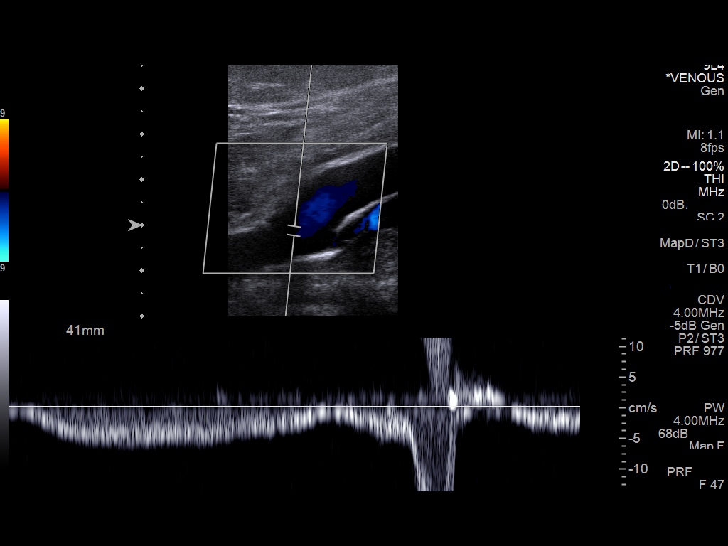
[im 23/28]
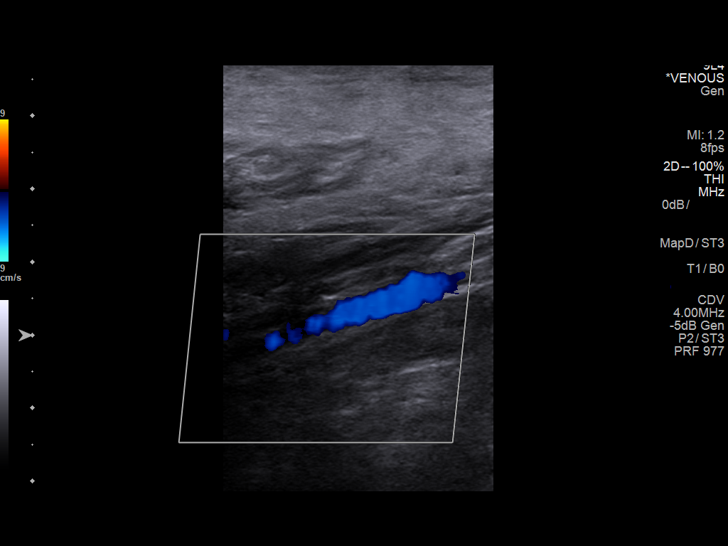
[im 25/28]
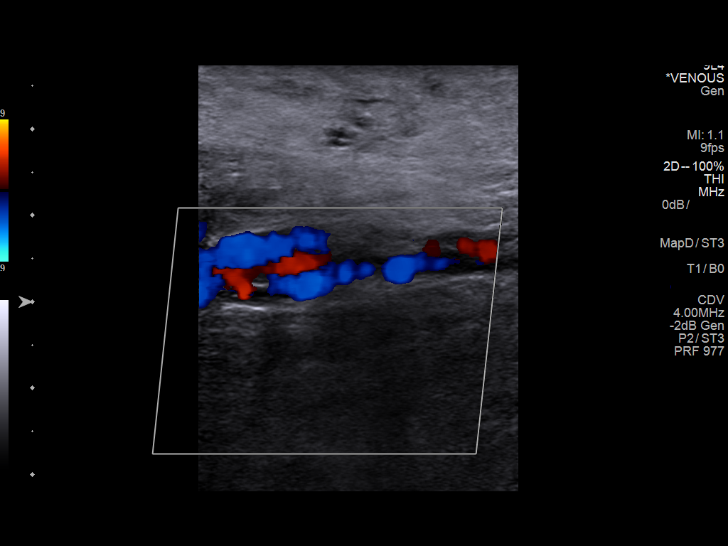
[im 28/28]
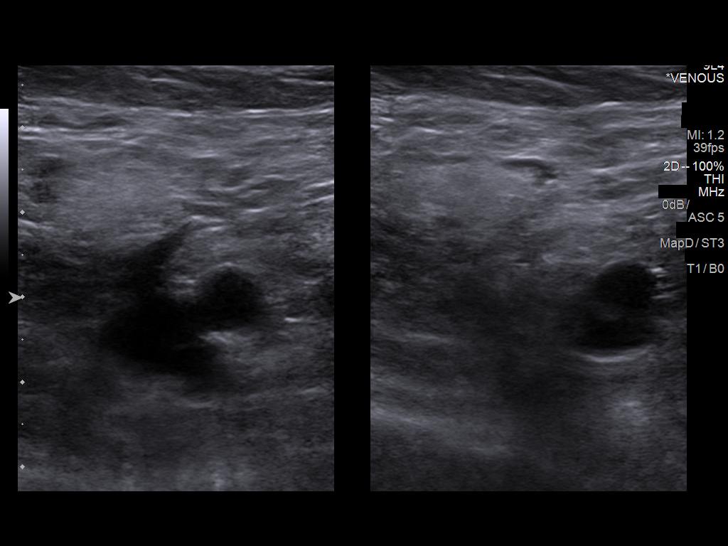

[13 of 24 positions shown; findings below may reference images not displayed]

FINDINGS: Contralateral Common Femoral Vein: Respiratory phasicity is normal
and symmetric with the symptomatic side. No evidence of thrombus.
Normal compressibility.

Common Femoral Vein: No evidence of thrombus. Normal
compressibility, respiratory phasicity and response to augmentation.

Saphenofemoral Junction: No evidence of thrombus. Normal
compressibility and flow on color Doppler imaging.

Profunda Femoral Vein: No evidence of thrombus. Normal
compressibility and flow on color Doppler imaging.

Femoral Vein: No evidence of thrombus. Normal compressibility,
respiratory phasicity and response to augmentation.

Popliteal Vein: No evidence of thrombus. Normal compressibility,
respiratory phasicity and response to augmentation.

Calf Veins: No evidence of thrombus. Normal compressibility and flow
on color Doppler imaging.

Superficial Great Saphenous Vein: No evidence of thrombus. Normal
compressibility and flow on color Doppler imaging.

Venous Reflux:  None.

Other Findings:  None.
IMPRESSION: No evidence of deep venous thrombosis.

## 2017-09-18 ENCOUNTER — Telehealth: Payer: Self-pay | Admitting: Family Medicine

## 2017-09-18 ENCOUNTER — Other Ambulatory Visit: Payer: Self-pay

## 2017-09-18 ENCOUNTER — Ambulatory Visit (INDEPENDENT_AMBULATORY_CARE_PROVIDER_SITE_OTHER): Payer: Medicare Other | Admitting: Family Medicine

## 2017-09-18 ENCOUNTER — Encounter: Payer: Self-pay | Admitting: Family Medicine

## 2017-09-18 ENCOUNTER — Ambulatory Visit (INDEPENDENT_AMBULATORY_CARE_PROVIDER_SITE_OTHER): Payer: Medicare Other

## 2017-09-18 VITALS — BP 131/81 | HR 66 | Temp 98.0°F | Resp 17 | Ht 68.0 in | Wt 238.2 lb

## 2017-09-18 VITALS — BP 131/81 | HR 66 | Temp 98.0°F | Resp 17 | Ht 68.0 in | Wt 238.4 lb

## 2017-09-18 DIAGNOSIS — E785 Hyperlipidemia, unspecified: Secondary | ICD-10-CM

## 2017-09-18 DIAGNOSIS — I10 Essential (primary) hypertension: Secondary | ICD-10-CM | POA: Diagnosis not present

## 2017-09-18 DIAGNOSIS — Z125 Encounter for screening for malignant neoplasm of prostate: Secondary | ICD-10-CM

## 2017-09-18 DIAGNOSIS — Z0000 Encounter for general adult medical examination without abnormal findings: Secondary | ICD-10-CM

## 2017-09-18 LAB — HEPATIC FUNCTION PANEL
ALBUMIN: 4 g/dL (ref 3.5–5.2)
ALT: 14 U/L (ref 0–53)
AST: 12 U/L (ref 0–37)
Alkaline Phosphatase: 65 U/L (ref 39–117)
Bilirubin, Direct: 0.1 mg/dL (ref 0.0–0.3)
Total Bilirubin: 0.5 mg/dL (ref 0.2–1.2)
Total Protein: 6.5 g/dL (ref 6.0–8.3)

## 2017-09-18 LAB — BASIC METABOLIC PANEL
BUN: 23 mg/dL (ref 6–23)
CHLORIDE: 100 meq/L (ref 96–112)
CO2: 33 meq/L — AB (ref 19–32)
Calcium: 9.4 mg/dL (ref 8.4–10.5)
Creatinine, Ser: 0.97 mg/dL (ref 0.40–1.50)
GFR: 79.49 mL/min (ref >60.00)
Glucose, Bld: 132 mg/dL — ABNORMAL HIGH (ref 70–99)
POTASSIUM: 4.5 meq/L (ref 3.5–5.1)
SODIUM: 140 meq/L (ref 135–145)

## 2017-09-18 LAB — CBC WITH DIFFERENTIAL/PLATELET
BASOS ABS: 0 10*3/uL (ref 0.0–0.1)
Basophils Relative: 0.3 % (ref 0.0–3.0)
EOS PCT: 2.5 % (ref 0.0–5.0)
Eosinophils Absolute: 0.2 10*3/uL (ref 0.0–0.7)
HCT: 46.8 % (ref 39.0–52.0)
HEMOGLOBIN: 16.1 g/dL (ref 13.0–17.0)
LYMPHS PCT: 31.4 % (ref 12.0–46.0)
Lymphs Abs: 2 10*3/uL (ref 0.7–4.0)
MCHC: 34.4 g/dL (ref 30.0–36.0)
MCV: 90.7 fl (ref 78.0–100.0)
MONOS PCT: 4.1 % (ref 3.0–12.0)
Monocytes Absolute: 0.3 10*3/uL (ref 0.1–1.0)
Neutro Abs: 3.9 10*3/uL (ref 1.4–7.7)
Neutrophils Relative %: 61.7 % (ref 43.0–77.0)
Platelets: 209 10*3/uL (ref 150.0–400.0)
RBC: 5.16 Mil/uL (ref 4.22–5.81)
RDW: 13.2 % (ref 11.5–15.5)
WBC: 6.3 10*3/uL (ref 4.0–10.5)

## 2017-09-18 LAB — PSA, MEDICARE: PSA: 1.38 ng/mL (ref 0.10–4.00)

## 2017-09-18 LAB — LIPID PANEL
Cholesterol: 152 mg/dL (ref 0–200)
HDL: 37.9 mg/dL — ABNORMAL LOW (ref >39.00)
LDL Cholesterol: 89 mg/dL (ref 0–99)
NonHDL: 114.31
Total CHOL/HDL Ratio: 4
Triglycerides: 127 mg/dL (ref 0.0–149.0)
VLDL: 25.4 mg/dL (ref 0.0–40.0)

## 2017-09-18 LAB — TSH: TSH: 1.88 u[IU]/mL (ref 0.35–4.50)

## 2017-09-18 NOTE — Telephone Encounter (Signed)
Patient arrived on time for appointment.

## 2017-09-18 NOTE — Progress Notes (Addendum)
Subjective:   Jeremiah Merritt is a 77 y.o. male who presents for Medicare Annual/Subsequent preventive examination.  Review of Systems:  No ROS.  Medicare Wellness Visit. Additional risk factors are reflected in the social history.  Cardiac Risk Factors include: advanced age (>62men, >87 women);hypertension;male gender;dyslipidemia;obesity (BMI >30kg/m2)   Sleep patterns: Sleeps 7-8 hours.  Home Safety/Smoke Alarms: Feels safe in home. Smoke alarms in place.  Living environment; residence and Firearm Safety: Lives with wife (son and DIL in separate apartment), in one story home.  Seat Belt Safety/Bike Helmet: Wears seat belt.   Male:   CCS-Colonoscopy 08/17/2016, polyps.  PSA-  Lab Results  Component Value Date   PSA 1.62 06/24/2016   PSA 1.31 06/22/2015   PSA 1.74 06/17/2014       Objective:    Vitals: BP 131/81   Pulse 66   Temp 98 F (36.7 C) (Oral)   Resp 17   Ht 5\' 8"  (1.727 m)   Wt 238 lb 6.4 oz (108.1 kg)   SpO2 98%   BMI 36.25 kg/m   Body mass index is 36.25 kg/m.  Advanced Directives 09/18/2017 04/12/2016  Does Patient Have a Medical Advance Directive? Yes Yes  Type of Paramedic of [place];Living will Living will  Copy of [place] in Chart? No - copy requested -    Tobacco Social History   Tobacco Use  Smoking Status Never Smoker  Smokeless Tobacco Never Used     Counseling given: Not Answered    Past Medical History:  Diagnosis Date  . COLONIC POLYPS, HX OF 02/27/2009  . ERECTILE DYSFUNCTION 12/08/2008  . HYPERLIPIDEMIA 12/08/2008  . HYPERTENSION 12/08/2008   Past Surgical History:  Procedure Laterality Date  . TONSILLECTOMY     Family History  Problem Relation Age of Onset  . Cancer Other        prostate   Social History   Socioeconomic History  . Marital status: Married    Spouse name: Not on file  . Number of children: Not on file  . Years of education: Not on file  . Highest  education level: Not on file  Social Needs  . Financial resource strain: Not on file  . Food insecurity - worry: Not on file  . Food insecurity - inability: Not on file  . Transportation needs - medical: Not on file  . Transportation needs - non-medical: Not on file  Occupational History  . Not on file  Tobacco Use  . Smoking status: Never Smoker  . Smokeless tobacco: Never Used  Substance and Sexual Activity  . Alcohol use: Not on file  . Drug use: Not on file  . Sexual activity: Not on file  Other Topics Concern  . Not on file  Social History Narrative  . Not on file    Outpatient Encounter Medications as of 09/18/2017  Medication Sig  . Omega-3 Fatty Acids (FISH OIL PO) Take by mouth.  Marland Kitchen acetaminophen (TYLENOL) 500 MG tablet Take 1,000 mg by mouth every morning.  Marland Kitchen amLODipine (NORVASC) 10 MG tablet TAKE ONE TABLET BY MOUTH ONE TIME DAILY   . furosemide (LASIX) 20 MG tablet TAKE ONE TABLET BY MOUTH ONE TIME DAILY  (Patient not taking: Reported on 09/18/2017)  . latanoprost (XALATAN) 0.005 % ophthalmic solution Place 1 drop into both eyes at bedtime.   . pravastatin (PRAVACHOL) 40 MG tablet TAKE ONE TABLET BY MOUTH ONE TIME DAILY    No facility-administered encounter  medications on file as of 09/18/2017.     Activities of Daily Living In your present state of health, do you have any difficulty performing the following activities: 09/18/2017 09/18/2017  Hearing? N N  Vision? N N  Difficulty concentrating or making decisions? N N  Walking or climbing stairs? N N  Dressing or bathing? N N  Doing errands, shopping? N N  Preparing Food and eating ? N -  Using the Toilet? N -  In the past six months, have you accidently leaked urine? N -  Do you have problems with loss of bowel control? N -  Managing your Medications? N -  Managing your Finances? N -  Housekeeping or managing your Housekeeping? N -  Some recent data might be hidden    Patient Care Team: Midge Minium, MD  as PCP - General (Family Medicine) Clarene Essex, MD as Consulting Physician (Gastroenterology)   Assessment:   This is a routine wellness examination for Jeremiah Merritt.  Exercise Activities and Dietary recommendations Current Exercise Habits: The patient does not participate in regular exercise at present, Exercise limited by: None identified   Diet (meal preparation, eat out, water intake, caffeinated beverages, dairy products, fruits and vegetables): Drinks water, coffee, diet soda and tea.   Eats fruits and vegetables. Fried foods 2-3/week.   Goals    . Increase physical activity     Increase activity.        Fall Risk Fall Risk  09/18/2017 09/18/2017 06/24/2016 06/23/2016 06/22/2015  Falls in the past year? No No No No No  Comment - - - Emmi Telephone Survey: data to providers prior to load -    Depression Screen PHQ 2/9 Scores 09/18/2017 09/18/2017 12/23/2016 06/24/2016  PHQ - 2 Score 0 0 0 0  PHQ- 9 Score - 0 0 0    Cognitive Function        Immunization History  Administered Date(s) Administered  . Influenza Split 04/20/2011, 04/19/2012  . Influenza Whole 04/05/2010  . Influenza, High Dose Seasonal PF 05/06/2014, 04/01/2016  . Influenza,inj,Quad PF,6+ Mos 04/24/2013, 05/21/2015, 05/22/2017  . Pneumococcal Conjugate-13 06/17/2014  . Pneumococcal Polysaccharide-23 07/19/2004, 06/24/2016  . Td 07/19/2004     Screening Tests Health Maintenance  Topic Date Due  . TETANUS/TDAP  09/19/2018 (Originally 07/19/2014)  . INFLUENZA VACCINE  Completed  . PNA vac Low Risk Adult  Completed      Plan:    Continue doing brain stimulating activities (puzzles, reading, adult coloring books, staying active) to keep memory sharp.   Bring a copy of your living will and/or healthcare power of attorney to your next office visit.  I have personally reviewed and noted the following in the patient's chart:   . Medical and social history . Use of alcohol, tobacco or illicit drugs  . Current  medications and supplements . Functional ability and status . Nutritional status . Physical activity . Advanced directives . List of other physicians . Hospitalizations, surgeries, and ER visits in previous 12 months . Vitals . Screenings to include cognitive, depression, and falls . Referrals and appointments  In addition, I have reviewed and discussed with patient certain preventive protocols, quality metrics, and best practice recommendations. A written personalized care plan for preventive services as well as general preventive health recommendations were provided to patient.     Gerilyn Nestle, RN  09/18/2017  Agree w/ documentation provided by RN and agree w/ above.  Annye Asa, MD

## 2017-09-18 NOTE — Assessment & Plan Note (Signed)
Chronic problem.  Adequate control today.  Asymptomatic.  Check labs.  No anticipated med changes.  Will follow. 

## 2017-09-18 NOTE — Patient Instructions (Addendum)
Follow up in 6 months to recheck BP and cholesterol We'll notify you of your lab results and make any changes if needed Continue to work on healthy diet and regular exercise- you can do it! Call with any questions or concerns Happy Spring!!!

## 2017-09-18 NOTE — Telephone Encounter (Signed)
Copied from [place] [id number]. Topic: Quick Communication - Patient Running Late >> Sep 18, 2017  1:09 PM Cleaster Corin, NT wrote: Patient called and is running 5 minutes late.  Let pt. Know about late policy    Route to department's PEC pool.

## 2017-09-18 NOTE — Assessment & Plan Note (Signed)
Chronic problem.  Tolerating statin w/o difficulty.  Check labs.  Adjust meds prn  

## 2017-09-18 NOTE — Assessment & Plan Note (Signed)
Pt has gained 6 lbs since last visit.  His BMI of 36.2 and his co-morbidities of hyperlipidemia and HTN qualify him as morbidly obese.  Stressed need for healthy diet and regular exercise.  Check labs to risk stratify.

## 2017-09-18 NOTE — Patient Instructions (Addendum)
Continue doing brain stimulating activities (puzzles, reading, adult coloring books, staying active) to keep memory sharp.   Bring a copy of your living will and/or healthcare power of attorney to your next office visit.   Health Maintenance, Male A healthy lifestyle and preventive care is important for your health and wellness. Ask your health care provider about what schedule of regular examinations is right for you. What should I know about weight and diet? Eat a Healthy Diet  Eat plenty of vegetables, fruits, whole grains, low-fat dairy products, and lean protein.  Do not eat a lot of foods high in solid fats, added sugars, or salt.  Maintain a Healthy Weight Regular exercise can help you achieve or maintain a healthy weight. You should:  Do at least 150 minutes of exercise each week. The exercise should increase your heart rate and make you sweat (moderate-intensity exercise).  Do strength-training exercises at least twice a week.  Watch Your Levels of Cholesterol and Blood Lipids  Have your blood tested for lipids and cholesterol every 5 years starting at 79 years of age. If you are at high risk for heart disease, you should start having your blood tested when you are 79 years old. You may need to have your cholesterol levels checked more often if: ? Your lipid or cholesterol levels are high. ? You are older than 79 years of age. ? You are at high risk for heart disease.  What should I know about cancer screening? Many types of cancers can be detected early and may often be prevented. Lung Cancer  You should be screened every year for lung cancer if: ? You are a current smoker who has smoked for at least 30 years. ? You are a former smoker who has quit within the past 15 years.  Talk to your health care provider about your screening options, when you should start screening, and how often you should be screened.  Colorectal Cancer  Routine colorectal cancer screening  usually begins at 79 years of age and should be repeated every 5-10 years until you are 79 years old. You may need to be screened more often if early forms of precancerous polyps or small growths are found. Your health care provider may recommend screening at an earlier age if you have risk factors for colon cancer.  Your health care provider may recommend using home test kits to check for hidden blood in the stool.  A small camera at the end of a tube can be used to examine your colon (sigmoidoscopy or colonoscopy). This checks for the earliest forms of colorectal cancer.  Prostate and Testicular Cancer  Depending on your age and overall health, your health care provider may do certain tests to screen for prostate and testicular cancer.  Talk to your health care provider about any symptoms or concerns you have about testicular or prostate cancer.  Skin Cancer  Check your skin from head to toe regularly.  Tell your health care provider about any new moles or changes in moles, especially if: ? There is a change in a mole's size, shape, or color. ? You have a mole that is larger than a pencil eraser.  Always use sunscreen. Apply sunscreen liberally and repeat throughout the day.  Protect yourself by wearing long sleeves, pants, a wide-brimmed hat, and sunglasses when outside.  What should I know about heart disease, diabetes, and high blood pressure?  If you are 18-39 years of age, have your blood pressure checked every   3-5 years. If you are 40 years of age or older, have your blood pressure checked every year. You should have your blood pressure measured twice-once when you are at a hospital or clinic, and once when you are not at a hospital or clinic. Record the average of the two measurements. To check your blood pressure when you are not at a hospital or clinic, you can use: ? An automated blood pressure machine at a pharmacy. ? A home blood pressure monitor.  Talk to your health care  provider about your target blood pressure.  If you are between 45-79 years old, ask your health care provider if you should take aspirin to prevent heart disease.  Have regular diabetes screenings by checking your fasting blood sugar level. ? If you are at a normal weight and have a low risk for diabetes, have this test once every three years after the age of 45. ? If you are overweight and have a high risk for diabetes, consider being tested at a younger age or more often.  A one-time screening for abdominal aortic aneurysm (AAA) by ultrasound is recommended for men aged 65-75 years who are current or former smokers. What should I know about preventing infection? Hepatitis B If you have a higher risk for hepatitis B, you should be screened for this virus. Talk with your health care provider to find out if you are at risk for hepatitis B infection. Hepatitis C Blood testing is recommended for:  Everyone born from 1945 through 1965.  Anyone with known risk factors for hepatitis C.  Sexually Transmitted Diseases (STDs)  You should be screened each year for STDs including gonorrhea and chlamydia if: ? You are sexually active and are younger than 79 years of age. ? You are older than 79 years of age and your health care provider tells you that you are at risk for this type of infection. ? Your sexual activity has changed since you were last screened and you are at an increased risk for chlamydia or gonorrhea. Ask your health care provider if you are at risk.  Talk with your health care provider about whether you are at high risk of being infected with HIV. Your health care provider may recommend a prescription medicine to help prevent HIV infection.  What else can I do?  Schedule regular health, dental, and eye exams.  Stay current with your vaccines (immunizations).  Do not use any tobacco products, such as cigarettes, chewing tobacco, and e-cigarettes. If you need help quitting, ask  your health care provider.  Limit alcohol intake to no more than 2 drinks per day. One drink equals 12 ounces of beer, 5 ounces of wine, or 1 ounces of hard liquor.  Do not use street drugs.  Do not share needles.  Ask your health care provider for help if you need support or information about quitting drugs.  Tell your health care provider if you often feel depressed.  Tell your health care provider if you have ever been abused or do not feel safe at home. This information is not intended to replace advice given to you by your health care provider. Make sure you discuss any questions you have with your health care provider. Document Released: 12/31/2007 Document Revised: 03/02/2016 Document Reviewed: 04/07/2015 Elsevier Interactive Patient Education  2018 [employer]  

## 2017-09-18 NOTE — Progress Notes (Signed)
   Subjective:    Patient ID: Jeremiah Merritt, male    DOB: 28-Mar-1941, 77 y.o.   MRN: [id number]  HPI HTN- chronic problem, on Amlodipine and Lasix daily w/ adequate control.  Denies CP, SOB, HAs, visual changes, edema.  Hyperlipidemia- chronic problem, on Pravastatin daily.  Denies abd pain, N/V.  Obesity- chronic problem.  Pt has gained 6 lbs since last visit.  Pt has 2 weight related co-morbidities (HTN, Hyperlipidemia) that classifies him as morbidly obese.  Not exercising regularly 'for the last 4 months'.   Review of Systems For ROS see HPI     Objective:   Physical Exam  Constitutional: He is oriented to person, place, and time. He appears well-developed and well-nourished. No distress.  obese  HENT:  Head: Normocephalic and atraumatic.  Eyes: Conjunctivae and EOM are normal. Pupils are equal, round, and reactive to light.  Neck: Normal range of motion. Neck supple. No thyromegaly present.  Cardiovascular: Normal rate, regular rhythm, normal heart sounds and intact distal pulses.  No murmur heard. Pulmonary/Chest: Effort normal and breath sounds normal. No respiratory distress.  Abdominal: Soft. Bowel sounds are normal. He exhibits no distension.  Musculoskeletal: He exhibits no edema.  Lymphadenopathy:    He has no cervical adenopathy.  Neurological: He is alert and oriented to person, place, and time. No cranial nerve deficit.  Skin: Skin is warm and dry.  Psychiatric: He has a normal mood and affect. His behavior is normal.  Vitals reviewed.         Assessment & Plan:

## 2017-09-19 ENCOUNTER — Other Ambulatory Visit (INDEPENDENT_AMBULATORY_CARE_PROVIDER_SITE_OTHER): Payer: Medicare Other

## 2017-09-19 DIAGNOSIS — R7309 Other abnormal glucose: Secondary | ICD-10-CM | POA: Diagnosis not present

## 2017-09-19 LAB — HEMOGLOBIN A1C: Hgb A1c MFr Bld: 5.7 % (ref 4.6–6.5)

## 2017-11-09 DIAGNOSIS — H401131 Primary open-angle glaucoma, bilateral, mild stage: Secondary | ICD-10-CM | POA: Diagnosis not present

## 2017-11-09 DIAGNOSIS — H524 Presbyopia: Secondary | ICD-10-CM | POA: Diagnosis not present

## 2017-11-09 DIAGNOSIS — H5203 Hypermetropia, bilateral: Secondary | ICD-10-CM | POA: Diagnosis not present

## 2017-11-09 DIAGNOSIS — H43812 Vitreous degeneration, left eye: Secondary | ICD-10-CM | POA: Diagnosis not present

## 2017-11-09 DIAGNOSIS — H52223 Regular astigmatism, bilateral: Secondary | ICD-10-CM | POA: Diagnosis not present

## 2017-11-09 DIAGNOSIS — H2513 Age-related nuclear cataract, bilateral: Secondary | ICD-10-CM | POA: Diagnosis not present

## 2017-12-12 ENCOUNTER — Other Ambulatory Visit: Payer: Self-pay | Admitting: Family Medicine

## 2018-01-10 ENCOUNTER — Other Ambulatory Visit: Payer: Self-pay | Admitting: General Practice

## 2018-01-10 MED ORDER — FUROSEMIDE 20 MG PO TABS: 20.0000 mg | ORAL_TABLET | Freq: Every day | ORAL | 0 refills | Status: DC

## 2018-02-14 DIAGNOSIS — H25813 Combined forms of age-related cataract, bilateral: Secondary | ICD-10-CM | POA: Diagnosis not present

## 2018-02-14 DIAGNOSIS — H43813 Vitreous degeneration, bilateral: Secondary | ICD-10-CM | POA: Diagnosis not present

## 2018-02-14 DIAGNOSIS — H40053 Ocular hypertension, bilateral: Secondary | ICD-10-CM | POA: Diagnosis not present

## 2018-02-28 ENCOUNTER — Other Ambulatory Visit: Payer: Self-pay | Admitting: Family Medicine

## 2018-02-28 MED ORDER — PRAVASTATIN SODIUM 40 MG PO TABS: 40.0000 mg | ORAL_TABLET | Freq: Every day | ORAL | 0 refills | Status: AC

## 2018-02-28 MED ORDER — AMLODIPINE BESYLATE 10 MG PO TABS: 10.0000 mg | ORAL_TABLET | Freq: Every day | ORAL | 0 refills | Status: AC

## 2018-02-28 MED ORDER — PRAVASTATIN SODIUM 40 MG PO TABS: 40.0000 mg | ORAL_TABLET | Freq: Every day | ORAL | 0 refills | Status: DC

## 2018-02-28 MED ORDER — FUROSEMIDE 20 MG PO TABS
20.0000 mg | ORAL_TABLET | Freq: Every day | ORAL | 0 refills | Status: AC
Start: 2018-02-28 — End: ?

## 2018-05-07 DIAGNOSIS — I10 Essential (primary) hypertension: Secondary | ICD-10-CM | POA: Diagnosis not present

## 2018-05-07 DIAGNOSIS — E782 Mixed hyperlipidemia: Secondary | ICD-10-CM | POA: Diagnosis not present

## 2018-05-07 DIAGNOSIS — N5201 Erectile dysfunction due to arterial insufficiency: Secondary | ICD-10-CM | POA: Diagnosis not present

## 2018-05-07 DIAGNOSIS — I872 Venous insufficiency (chronic) (peripheral): Secondary | ICD-10-CM | POA: Diagnosis not present

## 2018-05-07 DIAGNOSIS — Z23 Encounter for immunization: Secondary | ICD-10-CM | POA: Diagnosis not present

## 2018-05-07 DIAGNOSIS — R7302 Impaired glucose tolerance (oral): Secondary | ICD-10-CM | POA: Diagnosis not present

## 2018-05-18 DIAGNOSIS — H25813 Combined forms of age-related cataract, bilateral: Secondary | ICD-10-CM | POA: Diagnosis not present

## 2018-05-18 DIAGNOSIS — H40053 Ocular hypertension, bilateral: Secondary | ICD-10-CM | POA: Diagnosis not present

## 2018-09-20 DIAGNOSIS — J069 Acute upper respiratory infection, unspecified: Secondary | ICD-10-CM | POA: Diagnosis not present

## 2018-09-20 DIAGNOSIS — B9789 Other viral agents as the cause of diseases classified elsewhere: Secondary | ICD-10-CM | POA: Diagnosis not present

## 2018-09-20 DIAGNOSIS — R5383 Other fatigue: Secondary | ICD-10-CM | POA: Diagnosis not present

## 2019-02-08 IMAGING — US US EXTREM LOW VENOUS*L*
1 series · 13 of 24 positions shown · non-contrast
Comparison: 10/06/2015

CLINICAL DATA: Left posterior knee pain for 1 month. Remote history
of deep vein thrombosis.



[Series 1: us extrem low venous*left* · 0.10mm/px · 25 acquisitions, 13 frames shown]
[im 1/25]
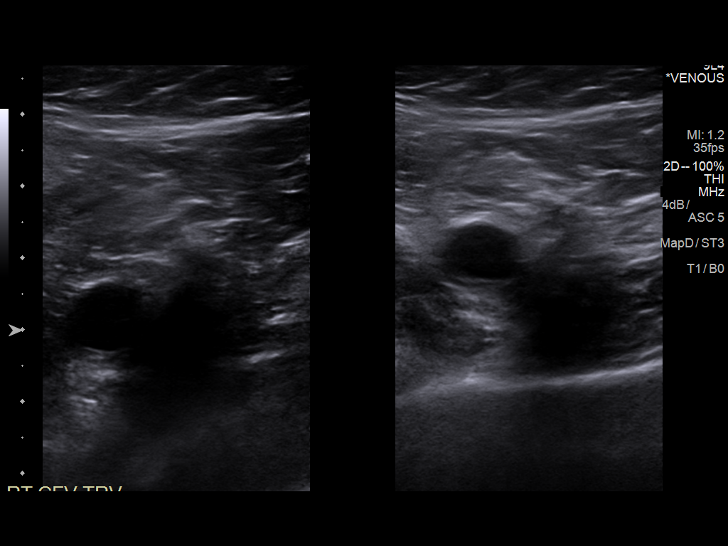
[im 3/25]
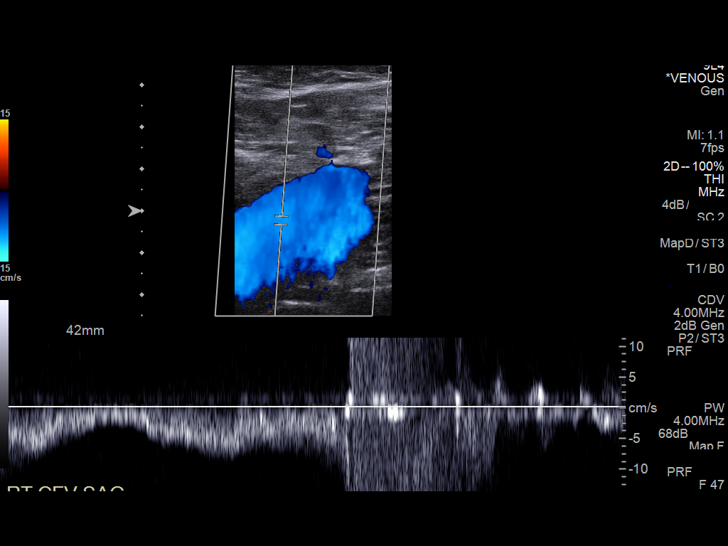
[im 5/25]
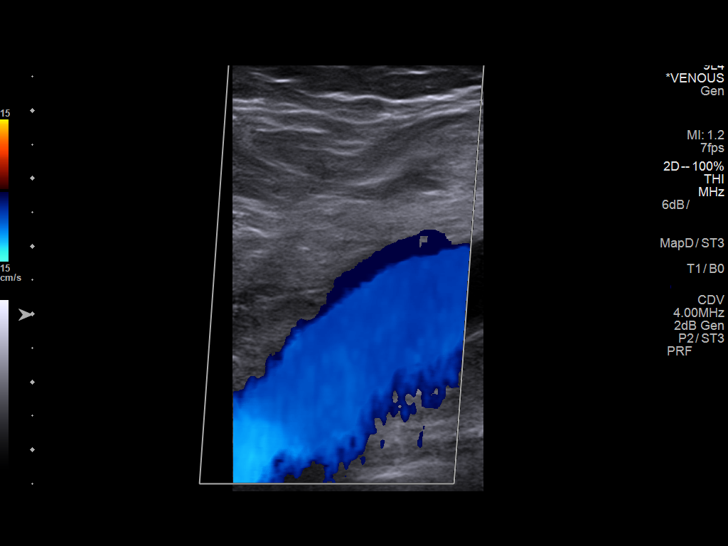
[im 7/25]
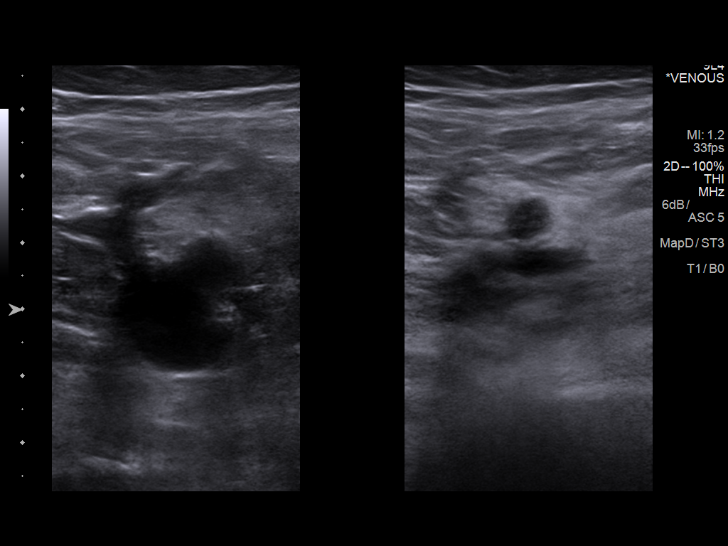
[im 9/25]
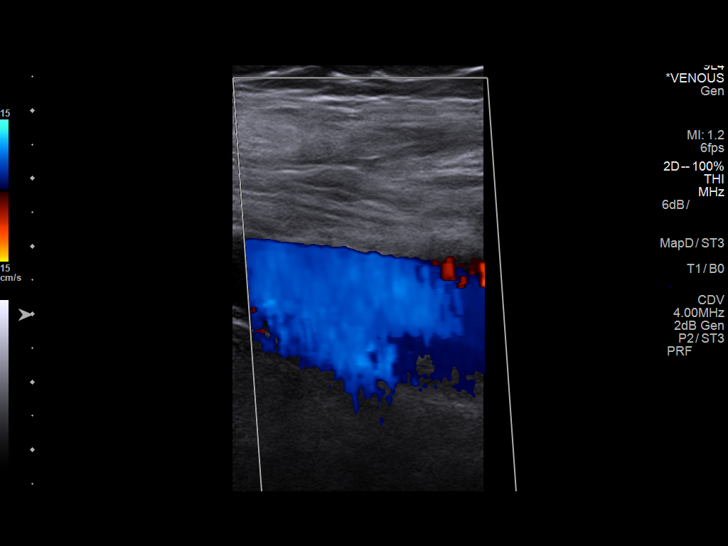
[im 11/25]
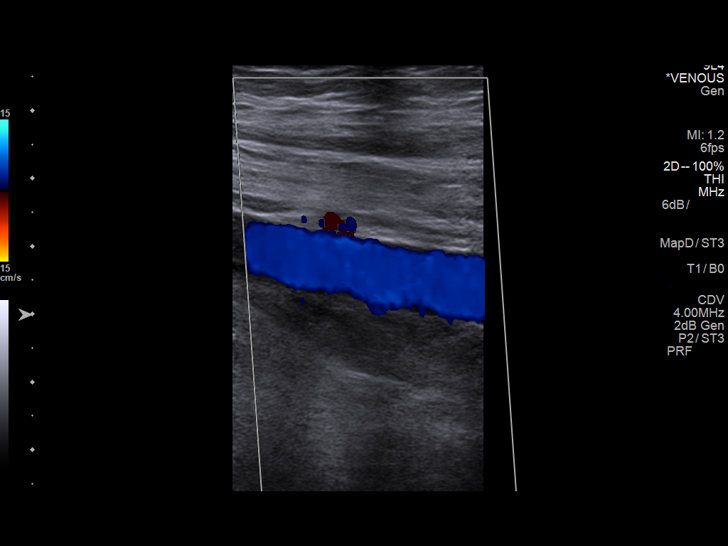
[im 14/25]
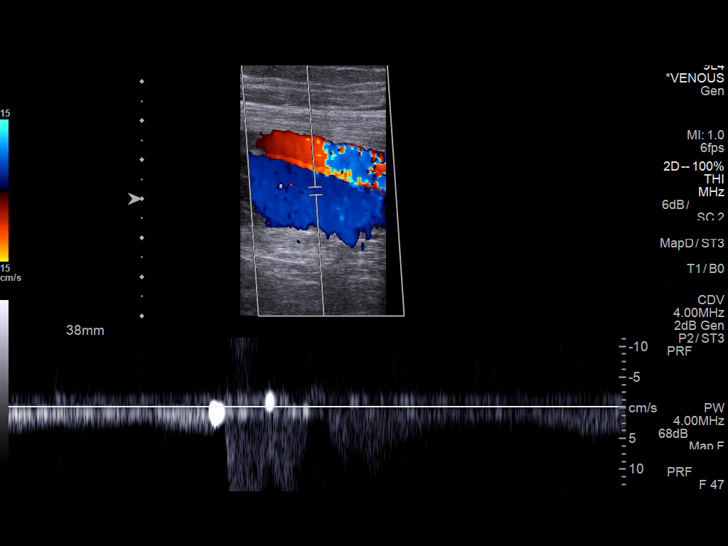
[im 15/25]
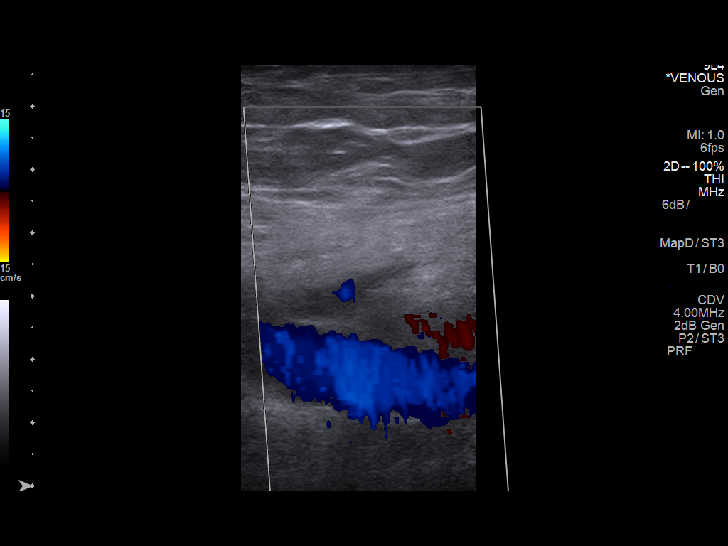
[im 17/25]
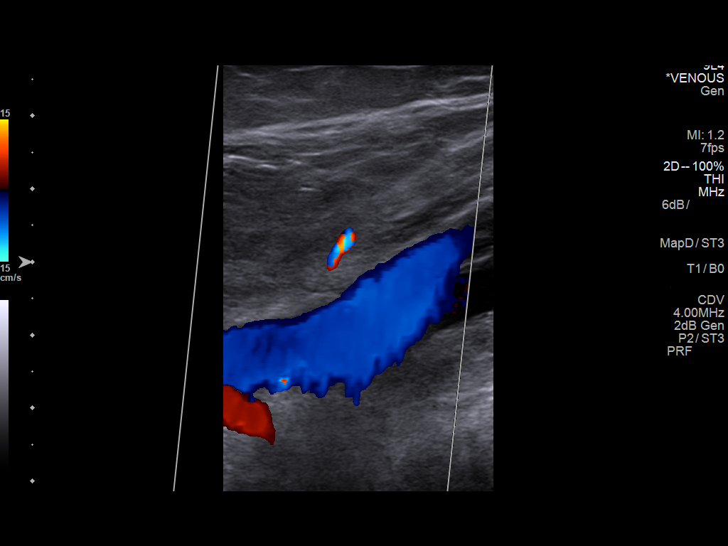
[im 19/25]
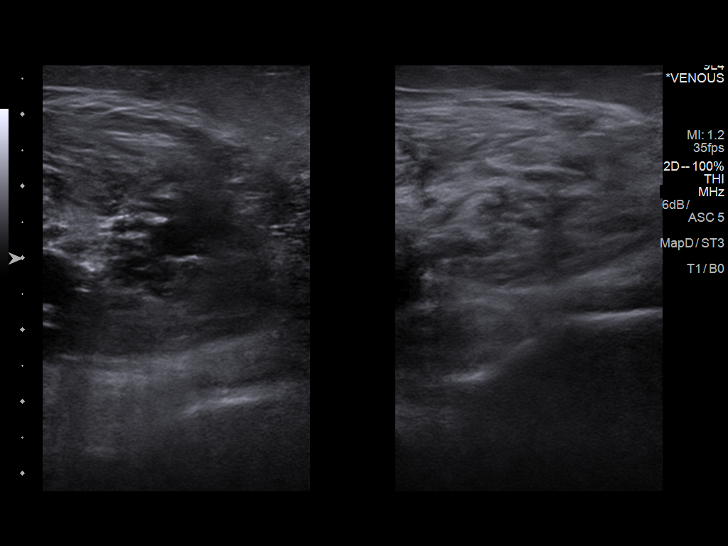
[im 21/25]
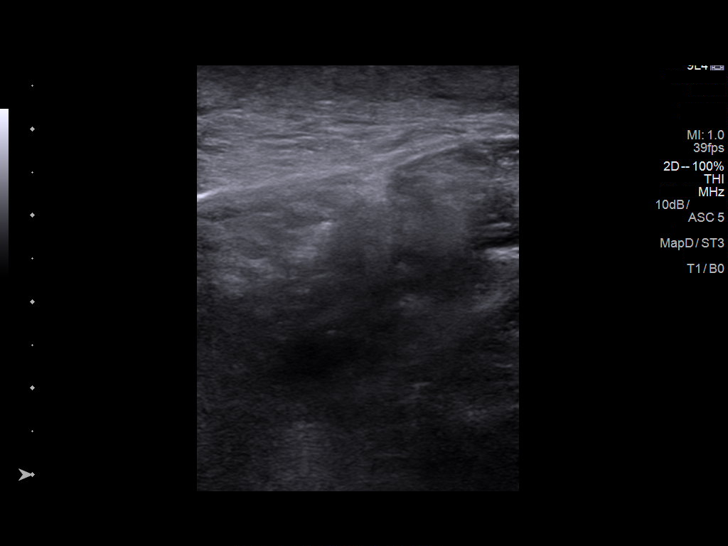
[im 22/25]
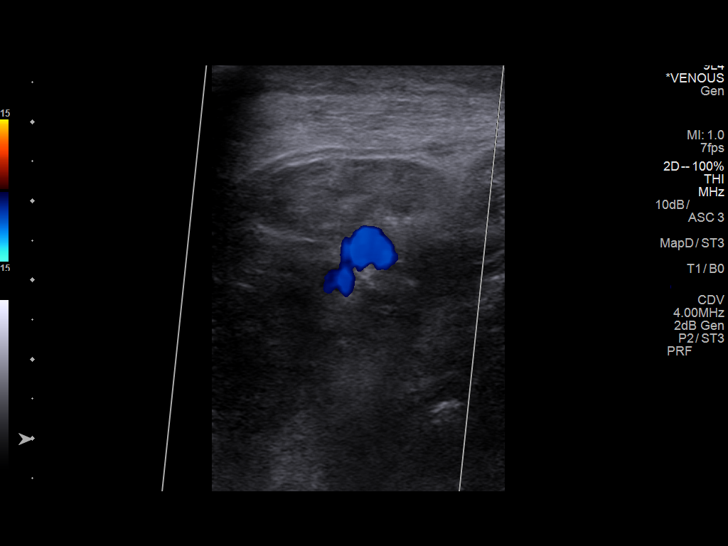
[im 25/25]
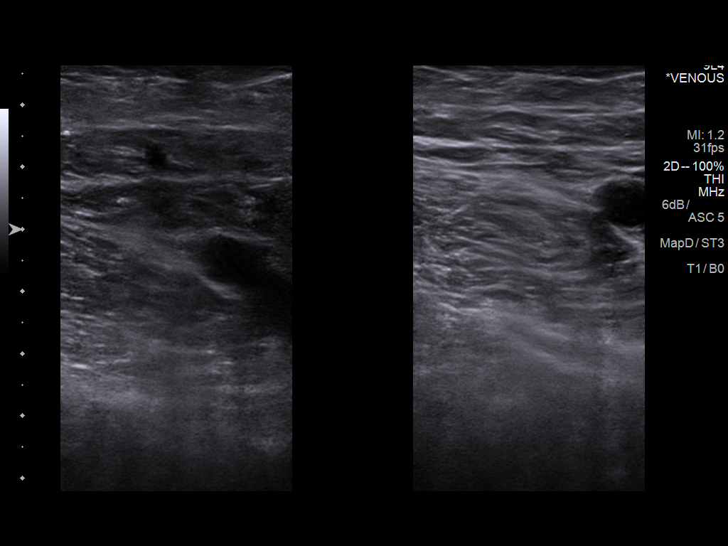

[13 of 24 positions shown; findings below may reference images not displayed]

FINDINGS: Contralateral Common Femoral Vein: Respiratory phasicity is normal
and symmetric with the symptomatic side. No evidence of thrombus.
Normal compressibility.

Common Femoral Vein: No evidence of thrombus. Normal
compressibility, respiratory phasicity and response to augmentation.

Saphenofemoral Junction: No evidence of thrombus. Normal
compressibility and flow on color Doppler imaging.

Profunda Femoral Vein: No evidence of thrombus. Normal
compressibility and flow on color Doppler imaging.

Femoral Vein: No evidence of thrombus. Normal compressibility,
respiratory phasicity and response to augmentation.

Popliteal Vein: No evidence of thrombus. Normal compressibility,
respiratory phasicity and response to augmentation.

Calf Veins: No evidence of thrombus. Normal compressibility and flow
on color Doppler imaging.

Superficial Great Saphenous Vein: No evidence of thrombus. Normal
compressibility and flow on color Doppler imaging.

Venous Reflux:  None.

Other Findings:  None.
IMPRESSION: No evidence of DVT within the left lower extremity.

## 2021-04-17 DEATH — deceased
